# Patient Record
Sex: Male | Born: 1965
Health system: Southern US, Community
[De-identification: ages and names within clinical notes are randomized; demographics above are authoritative.]

## PROBLEM LIST (undated history)

## (undated) DIAGNOSIS — R0789 Other chest pain: Secondary | ICD-10-CM

## (undated) DIAGNOSIS — I10 Essential (primary) hypertension: Secondary | ICD-10-CM

## (undated) DIAGNOSIS — I209 Angina pectoris, unspecified: Secondary | ICD-10-CM

## (undated) DIAGNOSIS — E785 Hyperlipidemia, unspecified: Secondary | ICD-10-CM

## (undated) DIAGNOSIS — I2510 Atherosclerotic heart disease of native coronary artery without angina pectoris: Secondary | ICD-10-CM

## (undated) HISTORY — DX: Hyperlipidemia, unspecified: E78.5

## (undated) HISTORY — DX: Essential (primary) hypertension: I10

## (undated) HISTORY — PX: WRIST SURGERY: SHX841

## (undated) HISTORY — DX: Other chest pain: R07.89

---

## 2004-08-31 ENCOUNTER — Ambulatory Visit: Payer: Self-pay | Admitting: Family Medicine

## 2004-10-19 ENCOUNTER — Ambulatory Visit: Payer: Self-pay | Admitting: Family Medicine

## 2011-02-19 ENCOUNTER — Ambulatory Visit (INDEPENDENT_AMBULATORY_CARE_PROVIDER_SITE_OTHER): Payer: BC Managed Care – PPO | Admitting: Cardiology

## 2011-02-19 ENCOUNTER — Encounter: Payer: Self-pay | Admitting: Cardiology

## 2011-02-19 VITALS — BP 133/96 | HR 62 | Resp 18 | Ht 71.0 in | Wt 206.1 lb

## 2011-02-19 DIAGNOSIS — I10 Essential (primary) hypertension: Secondary | ICD-10-CM

## 2011-02-19 DIAGNOSIS — R079 Chest pain, unspecified: Secondary | ICD-10-CM

## 2011-02-19 DIAGNOSIS — R0789 Other chest pain: Secondary | ICD-10-CM

## 2011-02-19 DIAGNOSIS — E785 Hyperlipidemia, unspecified: Secondary | ICD-10-CM

## 2011-02-19 NOTE — Progress Notes (Signed)
HPI: [name] is seen in the office today at the kind request of Dr. Lysbeth Galas for evaluation of chest discomfort and dyspnea.  This very nice young gentleman has enjoyed generally excellent health. He has a history of hypertension, but no diabetes or significant hyperlipidemia. He does not use tobacco products and is unaware of any significant positive family history for coronary disease. In recent weeks, he has experienced episodes of chest tightness that are typically associated with physical or emotional stress and resolve over the course of seconds to minutes with rest and relaxation.  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 Discomfort is relatively mild and is unassociated with dyspnea, diaphoresis or nausea. [name] is no history of cardiac disease, has never been evaluated by a cardiologist and has not previously undergone any significant cardiac testing.  Current Outpatient Prescriptions  Medication Sig Dispense Refill  . ibuprofen (ADVIL) 200 MG tablet Take 400 mg by mouth daily.        Marland Kitchen lisinopril (PRINIVIL,ZESTRIL) 5 MG tablet Take 5 mg by mouth daily.           No Known Allergies    Past Medical History  Diagnosis Date  . Hypertension   . Chest discomfort      Past Surgical History  Procedure Date  . Wrist surgery     Left     History reviewed. No pertinent family history.   History   Social History  . Marital Status: Married    Spouse Name: N/A    Number of Children: 2  . Years of Education: N/A   Occupational History  . Teacher    Social History Main Topics  . Smoking status: Never Smoker   . Smokeless tobacco: Never Used  . Alcohol Use: 0.5 oz/week    1 drink(s) per week  . Drug Use: Not on file  . Sexually Active: Not on file   Other Topics Concern  . Not on file    Social History Narrative  . No narrative on file     ROS: Denies orthopnea, PND, pedal edema, lightheadedness or syncope. Weight and appetite have been stable. No cough nor sputum production.  All other systems reviewed and are negative.  PHYSICAL EXAM: BP 133/96  Pulse 62  Resp 18  Ht 5\' 11"  (1.803 m)  Wt 206 lb 1.9 oz (93.495 kg)  BMI 28.75 kg/m2  SpO2 97%  General-Well-developed; no acute distress Body Habitus- overweight HEENT-[place]/AT; PERRL; EOM intact; conjunctiva and lids nl Neck-No JVD; no carotid bruits Endocrine-No thyromegaly Lungs-Clear lung fields; resonant percussion; normal I-to-E ratio Cardiovascular- normal PMI; normal S1 and S2; + S4 Abdomen-BS normal; soft and non-tender without masses or organomegaly Musculoskeletal-No deformities, cyanosis or clubbing Neurologic-Nl cranial nerves; symmetric strength and tone Skin- Warm, no significant lesions Extremities-Nl distal pulses; no edema  EKG:   Sinus bradycardia at a rate of 55 bpm; otherwise normal; no previous tracing for comparison.  ASSESSMENT AND PLAN:

## 2011-02-19 NOTE — Patient Instructions (Addendum)
Your physician recommends that you schedule a follow-up appointment at time of stress echo  Your physician has requested that you have a stress echocardiogram. For further information please visit [email or web address] Please follow instruction sheet as given.

## 2011-02-21 ENCOUNTER — Encounter: Payer: Self-pay | Admitting: Cardiology

## 2011-02-21 DIAGNOSIS — I10 Essential (primary) hypertension: Secondary | ICD-10-CM | POA: Insufficient documentation

## 2011-02-21 DIAGNOSIS — R0789 Other chest pain: Secondary | ICD-10-CM | POA: Insufficient documentation

## 2011-02-21 DIAGNOSIS — E785 Hyperlipidemia, unspecified: Secondary | ICD-10-CM | POA: Insufficient documentation

## 2011-02-21 NOTE — Assessment & Plan Note (Signed)
Although Tag describes a fairly active lifestyle, I suspect his exercise intolerance and dyspnea are related to physical deconditioning. Chest discomfort is atypical and unlikely to represent myocardial ischemia. A stress echocardiogram will be performed to evaluate left ventricular function, exclude other structural heart disease, evaluate exercise tolerance and exclude evidence for myocardial ischemia. I will reassess his symptoms when he returns for stress testing and formulate additional plans based upon results of that study.

## 2011-02-22 ENCOUNTER — Ambulatory Visit (HOSPITAL_COMMUNITY)
Admission: RE | Admit: 2011-02-22 | Discharge: 2011-02-22 | Disposition: A | Discharge status: Home / Self Care | Payer: BC Managed Care – PPO | Source: Ambulatory Visit | Attending: Cardiology | Admitting: Cardiology

## 2011-02-22 ENCOUNTER — Encounter (HOSPITAL_COMMUNITY): Payer: Self-pay | Admitting: Cardiology

## 2011-02-22 DIAGNOSIS — R0789 Other chest pain: Secondary | ICD-10-CM

## 2011-02-22 DIAGNOSIS — E785 Hyperlipidemia, unspecified: Secondary | ICD-10-CM | POA: Insufficient documentation

## 2011-02-22 DIAGNOSIS — R072 Precordial pain: Secondary | ICD-10-CM

## 2011-02-22 DIAGNOSIS — I10 Essential (primary) hypertension: Secondary | ICD-10-CM | POA: Insufficient documentation

## 2011-02-22 DIAGNOSIS — R079 Chest pain, unspecified: Secondary | ICD-10-CM

## 2011-02-22 NOTE — Progress Notes (Signed)
Stress Lab Nurses Notes - Carl Kim 02/22/2011  Reason for doing test: Chest Pain  Type of test: Stress Echo  Nurse performing test: Parke Poisson, RN  Nuclear Medicine Tech: Not Applicable  Echo Tech: Karrie Doffing  MD performing test: Ival Bible & K. Lyman Bishop, NP  Family MD: Nyland  Test explained and consent signed: yes  IV started: No IV started  Symptoms: mild SOB & fatigue  Treatment/Intervention: None  Reason test stopped: fatigue  After recovery IV was: NA  Patient to return to Nuc. Med at :NA  Patient discharged: Home  Patient's Condition upon discharge was: stable  Comments: Peak BP 182/68 & Hr 174.  Recovery BP 130/68 & Hr 90.  Symptoms resolved in recovery.  [name]

## 2011-02-22 NOTE — Progress Notes (Signed)
*  PRELIMINARY RESULTS* Echocardiogram Echocardiogram Stress Test has been performed.  Carl Kim 02/22/2011, 10:53 AM

## 2011-06-17 ENCOUNTER — Encounter (HOSPITAL_COMMUNITY): Payer: Self-pay

## 2011-06-17 ENCOUNTER — Emergency Department (HOSPITAL_COMMUNITY): Payer: BC Managed Care – PPO

## 2011-06-17 ENCOUNTER — Emergency Department (HOSPITAL_COMMUNITY)
Admission: EM | Admit: 2011-06-17 | Discharge: 2011-06-17 | Disposition: A | Discharge status: Home / Self Care | Payer: BC Managed Care – PPO | Attending: Emergency Medicine | Admitting: Emergency Medicine

## 2011-06-17 DIAGNOSIS — R10819 Abdominal tenderness, unspecified site: Secondary | ICD-10-CM | POA: Insufficient documentation

## 2011-06-17 DIAGNOSIS — D72829 Elevated white blood cell count, unspecified: Secondary | ICD-10-CM | POA: Insufficient documentation

## 2011-06-17 DIAGNOSIS — I10 Essential (primary) hypertension: Secondary | ICD-10-CM | POA: Insufficient documentation

## 2011-06-17 DIAGNOSIS — R197 Diarrhea, unspecified: Secondary | ICD-10-CM | POA: Insufficient documentation

## 2011-06-17 DIAGNOSIS — R112 Nausea with vomiting, unspecified: Secondary | ICD-10-CM | POA: Insufficient documentation

## 2011-06-17 DIAGNOSIS — R109 Unspecified abdominal pain: Secondary | ICD-10-CM | POA: Insufficient documentation

## 2011-06-17 LAB — COMPREHENSIVE METABOLIC PANEL
ALT: 24 U/L (ref 0–53)
AST: 20 U/L (ref 0–37)
Albumin: 4.2 g/dL (ref 3.5–5.2)
Alkaline Phosphatase: 65 U/L (ref 39–117)
BUN: 23 mg/dL (ref 6–23)
CO2: 27 mEq/L (ref 19–32)
Calcium: 9.3 mg/dL (ref 8.4–10.5)
Chloride: 105 mEq/L (ref 96–112)
Creatinine, Ser: 1.15 mg/dL (ref 0.50–1.35)
GFR calc Af Amer: 89 mL/min — ABNORMAL LOW (ref >90)
GFR calc non Af Amer: 76 mL/min — ABNORMAL LOW (ref >90)
Glucose, Bld: 124 mg/dL — ABNORMAL HIGH (ref 70–99)
Potassium: 4.7 mEq/L (ref 3.5–5.1)
Sodium: 138 mEq/L (ref 135–145)
Total Bilirubin: 2 mg/dL — ABNORMAL HIGH (ref 0.3–1.2)
Total Protein: 6.8 g/dL (ref 6.0–8.3)

## 2011-06-17 LAB — CBC
HCT: 43.8 % (ref 39.0–52.0)
Hemoglobin: 15.2 g/dL (ref 13.0–17.0)
MCH: 29.9 pg (ref 26.0–34.0)
MCHC: 34.7 g/dL (ref 30.0–36.0)
MCV: 86.1 fL (ref 78.0–100.0)
Platelets: 182 10*3/uL (ref 150–400)
RBC: 5.09 MIL/uL (ref 4.22–5.81)
RDW: 12.2 % (ref 11.5–15.5)
WBC: 14.3 10*3/uL — ABNORMAL HIGH (ref 4.0–10.5)

## 2011-06-17 LAB — URINALYSIS, ROUTINE W REFLEX MICROSCOPIC
Bilirubin Urine: NEGATIVE
Glucose, UA: NEGATIVE mg/dL
Hgb urine dipstick: NEGATIVE
Ketones, ur: 15 mg/dL — AB
Leukocytes, UA: NEGATIVE
Nitrite: NEGATIVE
Protein, ur: NEGATIVE mg/dL
Specific Gravity, Urine: 1.01 (ref 1.005–1.030)
Urobilinogen, UA: 0.2 mg/dL (ref 0.0–1.0)
pH: 5.5 (ref 5.0–8.0)

## 2011-06-17 LAB — LIPASE, BLOOD: Lipase: 24 U/L (ref 11–59)

## 2011-06-17 MED ORDER — ONDANSETRON HCL 4 MG/2ML IJ SOLN
4.0000 mg | Freq: Once | INTRAMUSCULAR | Status: AC
  Administered 2011-06-17: 4 mg via INTRAVENOUS
  Filled 2011-06-17: qty 2

## 2011-06-17 MED ORDER — ONDANSETRON HCL 4 MG PO TABS: 4.0000 mg | ORAL_TABLET | Freq: Four times a day (QID) | ORAL | Status: AC

## 2011-06-17 MED ORDER — SODIUM CHLORIDE 0.9 % IV BOLUS (SEPSIS)
1000.0000 mL | Freq: Once | INTRAVENOUS | Status: AC
  Administered 2011-06-17: 1000 mL via INTRAVENOUS

## 2011-06-17 MED ORDER — IOHEXOL 300 MG/ML  SOLN
100.0000 mL | Freq: Once | INTRAMUSCULAR | Status: AC | PRN
  Administered 2011-06-17: 100 mL via INTRAVENOUS

## 2011-06-17 MED ORDER — PROMETHAZINE HCL 25 MG/ML IJ SOLN
12.5000 mg | Freq: Once | INTRAMUSCULAR | Status: AC
  Administered 2011-06-17: 12.5 mg via INTRAVENOUS
  Filled 2011-06-17: qty 1

## 2011-06-17 MED ORDER — HYDROMORPHONE HCL PF 1 MG/ML IJ SOLN
1.0000 mg | Freq: Once | INTRAMUSCULAR | Status: AC
  Administered 2011-06-17: 1 mg via INTRAVENOUS
  Filled 2011-06-17: qty 1

## 2011-06-17 NOTE — ED Notes (Signed)
Pt resting in bed, pt's wife stated "he is out after those meds but he said that he was feeling better".

## 2011-06-17 NOTE — ED Notes (Signed)
Pt stated emesis and diarrhea started at 4pm today.

## 2011-06-17 NOTE — ED Notes (Signed)
MD at bedside. 

## 2011-06-17 NOTE — ED Notes (Signed)
Pt medicated for pain and nausea 10/10. Will reassess.

## 2011-06-17 NOTE — ED Notes (Signed)
Per ems, pt has been vomiting since 1600 today.  Pt reports some mid abd pain with the vomiting.  Pt actively vomiting upon arrival.

## 2011-06-22 NOTE — ED Provider Notes (Signed)
History    45yM with n/v/d. Onset around 1600. Persistent since. Crampy abdominal pain. This began after vomiting. No fever or chills. nbnb emesis. No blood in stool. No sick contacts. Denies hx of abdominal surgery. No urinary complaints.  CSN: [id number]  Arrival date & time 06/17/11  2031   First MD Initiated Contact with Patient 06/17/11 2035      Chief Complaint  Patient presents with  . Emesis    (Consider location/radiation/quality/duration/timing/severity/associated sxs/prior treatment) HPI  Past Medical History  Diagnosis Date  . Hypertension   . Chest discomfort   . Hyperlipidemia 02/21/2011    Past Surgical History  Procedure Date  . Wrist surgery     Left    No family history on file.  History  Substance Use Topics  . Smoking status: Never Smoker   . Smokeless tobacco: Never Used  . Alcohol Use: 0.5 oz/week    1 drink(s) per week      Review of Systems   Review of symptoms negative unless otherwise noted in HPI.   Allergies  Review of patient's allergies indicates no known allergies.  Home Medications   Current Outpatient Rx  Name Route Sig Dispense Refill  . IBUPROFEN 200 MG PO TABS Oral Take 400 mg by mouth daily.      Marland Kitchen LISINOPRIL 5 MG PO TABS Oral Take 5 mg by mouth daily.      Marland Kitchen ONDANSETRON HCL 4 MG PO TABS Oral Take 1 tablet (4 mg total) by mouth every 6 (six) hours. 12 tablet 0    BP 132/74  Pulse 91  Temp(Src) 99.1 F (37.3 C) (Oral)  Resp 21  Ht 5\' 11"  (1.803 m)  Wt 195 lb (88.451 kg)  BMI 27.20 kg/m2  SpO2 98%  Physical Exam  Nursing note and vitals reviewed. Constitutional: He appears well-developed and well-nourished.       Sitting up in bed retching.  HENT:  Head: Normocephalic and atraumatic.  Eyes: Conjunctivae are normal. Right eye exhibits no discharge. Left eye exhibits no discharge.  Neck: Neck supple.  Cardiovascular: Normal rate, regular rhythm and normal heart sounds.  Exam reveals no gallop and no friction  rub.   No murmur heard. Pulmonary/Chest: Effort normal and breath sounds normal. No respiratory distress.  Abdominal: Soft. He exhibits no distension. There is tenderness.       Mild diffuse tenderness. No gaurding or rebound.  Genitourinary:       No cva tenderness  Musculoskeletal: He exhibits no edema and no tenderness.  Neurological: He is alert.  Skin: Skin is warm and dry. He is not diaphoretic.  Psychiatric: His behavior is normal. Thought content normal.    ED Course  Procedures (including critical care time)  Labs Reviewed  COMPREHENSIVE METABOLIC PANEL - Abnormal; Notable for the following:    Glucose, Bld 124 (*)    Total Bilirubin 2.0 (*)    GFR calc non Af Amer 76 (*)    GFR calc Af Amer 89 (*)    All other components within normal limits  URINALYSIS, ROUTINE W REFLEX MICROSCOPIC - Abnormal; Notable for the following:    Ketones, ur 15 (*)    All other components within normal limits  CBC - Abnormal; Notable for the following:    WBC 14.3 (*)    All other components within normal limits  LIPASE, BLOOD  LAB REPORT - SCANNED   No results found.   1. Abdominal pain   2. Nausea and vomiting  3. Diarrhea       MDM  45yM with abdominal pain and n/v/d. Consider viral, obstruction, pancreatitis, etc. Suspect viral. Only hours of symptoms and improved with IVF and meds. CT with no acute findings. Mild leukocytosis, suspect stress demargination. Plan continued symptomatic care and fu as needed. Return precautions discussed.        Raeford Razor, MD 06/22/11 1052

## 2011-11-16 ENCOUNTER — Emergency Department (HOSPITAL_COMMUNITY)
Admission: EM | Admit: 2011-11-16 | Discharge: 2011-11-16 | Disposition: A | Discharge status: Home / Self Care | Payer: BC Managed Care – PPO | Source: Home / Self Care | Attending: Family Medicine | Admitting: Family Medicine

## 2011-11-16 ENCOUNTER — Encounter (HOSPITAL_COMMUNITY): Payer: Self-pay

## 2011-11-16 DIAGNOSIS — G510 Bell's palsy: Secondary | ICD-10-CM

## 2011-11-16 NOTE — ED Provider Notes (Signed)
History     CSN: [id number]  Arrival date & time 11/16/11  1102   First MD Initiated Contact with Patient 11/16/11 1154      Chief Complaint  Patient presents with  . Facial Droop    (Consider location/radiation/quality/duration/timing/severity/associated sxs/prior treatment) HPI Comments: Pt with L face/head numbness and pain behind L ear for 2 weeks. Saw pcp, told probably with sinus infection.  Saw pcp again 3 days ago for worsening sx, told maybe had lyme disease, rx an antibiotic.  Last night began having facial droop, can't close eye.  Saw pcp this morning again, told had Bell palsy. Here for 2nd opinion.    Patient is a 46 y.o. male presenting with neurologic complaint. The history is provided by the patient.  Neurologic Problem The primary symptoms include focal weakness. Primary symptoms do not include dizziness or fever. The symptoms began yesterday. The symptoms are unchanged. The neurological symptoms are focal.  Weakness began 12 - 24 hours ago. The weakness is unchanged. There is weakness in these regions/motions: facial muscles. There is impairment of the following actions: closing eyes.  Additional symptoms do not include photophobia. Medical issues also include hypertension.    Past Medical History  Diagnosis Date  . Hypertension   . Chest discomfort   . Hyperlipidemia 02/21/2011    Past Surgical History  Procedure Date  . Wrist surgery     Left    History reviewed. No pertinent family history.  History  Substance Use Topics  . Smoking status: Never Smoker   . Smokeless tobacco: Never Used  . Alcohol Use: 0.5 oz/week    1 drink(s) per week      Review of Systems  Constitutional: Negative for fever and chills.  HENT: Positive for ear pain and rhinorrhea. Negative for facial swelling, neck pain and ear discharge.        Pain behind L ear  Eyes: Positive for discharge. Negative for photophobia.       Eye watering, not eye discharge; can't close eye    Skin: Negative for color change and wound.  Neurological: Positive for focal weakness, facial asymmetry and numbness. Negative for dizziness.    Allergies  Review of patient's allergies indicates no known allergies.  Home Medications   Current Outpatient Rx  Name Route Sig Dispense Refill  . ACYCLOVIR 800 MG PO TABS Oral Take 800 mg by mouth 2 (two) times daily.    Marland Kitchen PREDNISONE (PAK) 10 MG PO TABS Oral Take 10 mg by mouth daily.    . IBUPROFEN 200 MG PO TABS Oral Take 400 mg by mouth daily.      Marland Kitchen LISINOPRIL 5 MG PO TABS Oral Take 5 mg by mouth daily.        BP 156/100  Pulse 70  Temp 97.3 F (36.3 C) (Oral)  Resp 18  SpO2 100%  Physical Exam  Constitutional: He is oriented to person, place, and time. He appears well-developed and well-nourished. No distress.  HENT:  Head: Normocephalic and atraumatic.  Right Ear: Tympanic membrane, external ear and ear canal normal. No mastoid tenderness.  Left Ear: Tympanic membrane, external ear and ear canal normal. No mastoid tenderness.       L facial droop. Postauricular tenderness.   Eyes: Right eye exhibits no discharge and no exudate. Left eye exhibits no discharge and no exudate.       L eyelid does not completely close.   Pulmonary/Chest: Effort normal.  Neurological: He is alert and oriented  to person, place, and time. A sensory deficit is present. Coordination and gait normal.       L facial droop; B grips strength equal and strong    ED Course  Procedures (including critical care time)  Labs Reviewed - No data to display No results found.   1. Bell palsy       MDM  Sx c/w bell palsy.  Reassured pt about diagnosis.  Discussed potential for eye injury.         Cathlyn Parsons, NP 11/16/11 1202

## 2011-11-16 NOTE — Discharge Instructions (Signed)
Purchase artificial tears and use several times a day to help keep your moist.  At night try using an eye lubricant (such as Lacrilube).  You might also try taping your eye shut at night.  Be sure to monitor the taped eye through the night- if the tape comes off, don't try taping it again.  It's important to make sure the tape doesn't get into/injure the eye.  Please see an opthamologist if you have any concerns about your eye.    Pain behind the ear is VERY common in Bell's Palsy.   Bell's Palsy Bell's palsy is a condition in which the muscles on one side of the face cannot move (paralysis). This is because the nerves in the face are paralyzed. It is most often thought to be caused by a virus. The virus causes swelling of the nerve that controls movement on one side of the face. The nerve travels through a tight space surrounded by bone. When the nerve swells, it can be compressed by the bone. This results in damage to the protective covering around the nerve. This damage interferes with how the nerve communicates with the muscles of the face. As a result, it can cause weakness or paralysis of the facial muscles.  Injury (trauma), tumor, and surgery may cause Bell's palsy, but most of the time the cause is unknown. It is a relatively common condition. It starts suddenly (abrupt onset) with the paralysis usually ending within 2 days. Bell's palsy is not dangerous. But because the eye does not close properly, you may need care to keep the eye from getting dry. This can include splinting (to keep the eye shut) or moistening with artificial tears. Bell's palsy very seldom occurs on both sides of the face at the same time. SYMPTOMS   Eyebrow sagging.   Drooping of the eyelid and corner of the mouth.   Inability to close one eye.   Loss of taste on the front of the tongue.   Sensitivity to loud noises.  TREATMENT  The treatment is usually non-surgical. If the patient is seen within the first 24 to 48  hours, a short course of steroids may be prescribed, in an attempt to shorten the length of the condition. Antiviral medicines may also be used with the steroids, but it is unclear if they are helpful.  You will need to protect your eye, if you cannot close it. The cornea (clear covering over your eye) will become dry and can be damaged. Artificial tears can be used to keep your eye moist. Glasses or an eye patch should be worn to protect your eye. PROGNOSIS  Recovery is variable, ranging from days to months. Although the problem usually goes away completely (about 80% of cases resolve), predicting the outcome is impossible. Most people improve within 3 weeks of when the symptoms began. Improvement may continue for 3 to 6 months. A small number of people have moderate to severe weakness that is permanent.  HOME CARE INSTRUCTIONS   If your caregiver prescribed medication to reduce swelling in the nerve, use as directed. Do not stop taking the medication unless directed by your caregiver.   Use moisturizing eye drops as needed to prevent drying of your eye, as directed by your caregiver.   Protect your eye, as directed by your caregiver.   Use facial massage and exercises, as directed by your caregiver.   Perform your normal activities, and get your normal rest.  SEEK IMMEDIATE MEDICAL CARE IF:  There is pain, redness or irritation in the eye.   You or your child has an oral temperature above 102 F (38.9 C), not controlled by medicine.  MAKE SURE YOU:   Understand these instructions.   Will watch your condition.   Will get help right away if you are not doing well or get worse.  Document Released: 05/21/2005 Document Revised: 05/10/2011 Document Reviewed: 05/30/2009 [facility] Patient Information 2012 [place], [place].

## 2011-11-16 NOTE — ED Notes (Signed)
States he was in his MD office this AM for 2 week duration of facial weakness/numbness and concern for poss Lymes disease(no known hist of tick bite) Has left sided facial sagging and reported numbness . Has already started acyclovir and prednisone , doxycycline; "I want a second opinion"

## 2011-11-17 NOTE — ED Provider Notes (Signed)
Medical screening examination/treatment/procedure(s) were performed by resident physician or non-physician practitioner and as supervising physician I was immediately available for consultation/collaboration.   Barkley Bruns MD.    Linna Hoff, MD 11/17/11 [phone]

## 2012-06-04 DIAGNOSIS — R0789 Other chest pain: Secondary | ICD-10-CM

## 2012-06-04 HISTORY — DX: Other chest pain: R07.89

## 2013-02-17 ENCOUNTER — Ambulatory Visit: Payer: BC Managed Care – PPO | Attending: Neurosurgery | Admitting: Physical Therapy

## 2013-02-17 DIAGNOSIS — IMO0001 Reserved for inherently not codable concepts without codable children: Secondary | ICD-10-CM | POA: Insufficient documentation

## 2013-02-17 DIAGNOSIS — R5381 Other malaise: Secondary | ICD-10-CM | POA: Insufficient documentation

## 2013-02-17 DIAGNOSIS — M6281 Muscle weakness (generalized): Secondary | ICD-10-CM | POA: Insufficient documentation

## 2013-02-17 DIAGNOSIS — M542 Cervicalgia: Secondary | ICD-10-CM | POA: Insufficient documentation

## 2013-02-19 ENCOUNTER — Ambulatory Visit: Payer: BC Managed Care – PPO | Admitting: *Deleted

## 2013-02-23 ENCOUNTER — Ambulatory Visit: Payer: BC Managed Care – PPO | Admitting: Physical Therapy

## 2013-02-25 ENCOUNTER — Ambulatory Visit: Payer: BC Managed Care – PPO | Admitting: Physical Therapy

## 2013-03-02 ENCOUNTER — Ambulatory Visit: Payer: BC Managed Care – PPO | Admitting: Physical Therapy

## 2013-03-04 ENCOUNTER — Ambulatory Visit: Payer: BC Managed Care – PPO | Attending: Neurosurgery | Admitting: Physical Therapy

## 2013-03-04 DIAGNOSIS — M542 Cervicalgia: Secondary | ICD-10-CM | POA: Insufficient documentation

## 2013-03-04 DIAGNOSIS — M6281 Muscle weakness (generalized): Secondary | ICD-10-CM | POA: Insufficient documentation

## 2013-03-04 DIAGNOSIS — IMO0001 Reserved for inherently not codable concepts without codable children: Secondary | ICD-10-CM | POA: Insufficient documentation

## 2013-03-04 DIAGNOSIS — R5381 Other malaise: Secondary | ICD-10-CM | POA: Insufficient documentation

## 2013-03-09 ENCOUNTER — Ambulatory Visit: Payer: BC Managed Care – PPO | Admitting: Physical Therapy

## 2013-03-11 ENCOUNTER — Ambulatory Visit: Payer: BC Managed Care – PPO | Admitting: Physical Therapy

## 2014-06-04 HISTORY — PX: CERVICAL DISCECTOMY: SHX98

## 2017-10-02 ENCOUNTER — Encounter: Payer: Self-pay | Admitting: Internal Medicine

## 2017-10-17 DIAGNOSIS — M9902 Segmental and somatic dysfunction of thoracic region: Secondary | ICD-10-CM | POA: Diagnosis not present

## 2017-10-17 DIAGNOSIS — M9903 Segmental and somatic dysfunction of lumbar region: Secondary | ICD-10-CM | POA: Diagnosis not present

## 2017-10-17 DIAGNOSIS — M62830 Muscle spasm of back: Secondary | ICD-10-CM | POA: Diagnosis not present

## 2017-10-17 DIAGNOSIS — M9901 Segmental and somatic dysfunction of cervical region: Secondary | ICD-10-CM | POA: Diagnosis not present

## 2017-10-21 DIAGNOSIS — M9902 Segmental and somatic dysfunction of thoracic region: Secondary | ICD-10-CM | POA: Diagnosis not present

## 2017-10-21 DIAGNOSIS — M9901 Segmental and somatic dysfunction of cervical region: Secondary | ICD-10-CM | POA: Diagnosis not present

## 2017-10-21 DIAGNOSIS — M9903 Segmental and somatic dysfunction of lumbar region: Secondary | ICD-10-CM | POA: Diagnosis not present

## 2017-10-21 DIAGNOSIS — M62830 Muscle spasm of back: Secondary | ICD-10-CM | POA: Diagnosis not present

## 2017-10-23 DIAGNOSIS — M9903 Segmental and somatic dysfunction of lumbar region: Secondary | ICD-10-CM | POA: Diagnosis not present

## 2017-10-23 DIAGNOSIS — M62830 Muscle spasm of back: Secondary | ICD-10-CM | POA: Diagnosis not present

## 2017-10-23 DIAGNOSIS — M9901 Segmental and somatic dysfunction of cervical region: Secondary | ICD-10-CM | POA: Diagnosis not present

## 2017-10-23 DIAGNOSIS — M9902 Segmental and somatic dysfunction of thoracic region: Secondary | ICD-10-CM | POA: Diagnosis not present

## 2017-10-30 DIAGNOSIS — M62830 Muscle spasm of back: Secondary | ICD-10-CM | POA: Diagnosis not present

## 2017-10-30 DIAGNOSIS — M9903 Segmental and somatic dysfunction of lumbar region: Secondary | ICD-10-CM | POA: Diagnosis not present

## 2017-10-30 DIAGNOSIS — M9902 Segmental and somatic dysfunction of thoracic region: Secondary | ICD-10-CM | POA: Diagnosis not present

## 2017-10-30 DIAGNOSIS — M9901 Segmental and somatic dysfunction of cervical region: Secondary | ICD-10-CM | POA: Diagnosis not present

## 2017-11-13 ENCOUNTER — Other Ambulatory Visit: Payer: Self-pay

## 2017-11-27 DIAGNOSIS — M62830 Muscle spasm of back: Secondary | ICD-10-CM | POA: Diagnosis not present

## 2017-11-27 DIAGNOSIS — M9903 Segmental and somatic dysfunction of lumbar region: Secondary | ICD-10-CM | POA: Diagnosis not present

## 2017-11-27 DIAGNOSIS — M9902 Segmental and somatic dysfunction of thoracic region: Secondary | ICD-10-CM | POA: Diagnosis not present

## 2017-11-27 DIAGNOSIS — M9901 Segmental and somatic dysfunction of cervical region: Secondary | ICD-10-CM | POA: Diagnosis not present

## 2017-11-28 ENCOUNTER — Telehealth: Payer: Self-pay

## 2017-11-28 NOTE — Telephone Encounter (Signed)
Patient No Showed for Pre-Visit today. A message was left on voice mail to call and reschedule his PV before 5 Pm today. If patient does not call a no show letter will be mailed and his colonoscopy that is scheduled for 12/18/17 will be cancelled per [facility] guidelines.   Janalee DaneNancy Shalina Norfolk, LPN  ( PV )

## 2017-12-03 ENCOUNTER — Ambulatory Visit (AMBULATORY_SURGERY_CENTER): Payer: Self-pay

## 2017-12-03 VITALS — Ht 70.0 in | Wt 210.6 lb

## 2017-12-03 DIAGNOSIS — Z1211 Encounter for screening for malignant neoplasm of colon: Secondary | ICD-10-CM

## 2017-12-03 NOTE — Progress Notes (Signed)
Per pt, no allergies to soy or egg products.Pt not taking any weight loss meds or using  O2 at home.  Pt refused emmi video. 

## 2017-12-04 ENCOUNTER — Encounter: Payer: Self-pay | Admitting: Internal Medicine

## 2017-12-12 ENCOUNTER — Encounter: Payer: Self-pay | Admitting: Internal Medicine

## 2017-12-18 ENCOUNTER — Encounter: Payer: Self-pay | Admitting: Internal Medicine

## 2017-12-18 ENCOUNTER — Ambulatory Visit (AMBULATORY_SURGERY_CENTER): Payer: BLUE CROSS/BLUE SHIELD | Admitting: Internal Medicine

## 2017-12-18 VITALS — BP 106/74 | HR 63 | Temp 97.8°F | Resp 15 | Ht 70.0 in | Wt 210.0 lb

## 2017-12-18 DIAGNOSIS — Z1211 Encounter for screening for malignant neoplasm of colon: Secondary | ICD-10-CM | POA: Diagnosis present

## 2017-12-18 MED ORDER — SODIUM CHLORIDE 0.9 % IV SOLN: 500.0000 mL | Freq: Once | INTRAVENOUS | Status: DC

## 2017-12-18 NOTE — Progress Notes (Signed)
Report given to PACU, vss 

## 2017-12-18 NOTE — Op Note (Signed)
[facility] Patient Name: Carl Kim Procedure Date: 12/18/2017 8:56 AM MRN: [id number] Endoscopist: Iva Boop , MD Age: 52 Referring MD:  Date of Birth: 1965/10/09 Gender: Male Account #: [id number] Procedure:                Colonoscopy Indications:              Screening for colorectal malignant neoplasm, This                            is the patient's first colonoscopy Medicines:                Propofol per Anesthesia, Monitored Anesthesia Care Procedure:                Pre-Anesthesia Assessment:                           - Prior to the procedure, a History and Physical                            was performed, and patient medications and                            allergies were reviewed. The patient's tolerance of                            previous anesthesia was also reviewed. The risks                            and benefits of the procedure and the sedation                            options and risks were discussed with the patient.                            All questions were answered, and informed consent                            was obtained. Prior Anticoagulants: The patient has                            taken no previous anticoagulant or antiplatelet                            agents. ASA Grade Assessment: II - A patient with                            mild systemic disease. After reviewing the risks                            and benefits, the patient was deemed in                            satisfactory condition to undergo the procedure.  After obtaining informed consent, the colonoscope                            was passed under direct vision. Throughout the                            procedure, the patient's blood pressure, pulse, and                            oxygen saturations were monitored continuously. The                            Colonoscope was introduced through the anus and   advanced to the the cecum, identified by                            appendiceal orifice and ileocecal valve. The                            quality of the bowel preparation was excellent. The                            colonoscopy was performed without difficulty. The                            patient tolerated the procedure well. The bowel                            preparation used was Miralax. Scope In: 9:11:09 AM Scope Out: 9:24:29 AM Scope Withdrawal Time: 0 hours 10 minutes 34 seconds  Total Procedure Duration: 0 hours 13 minutes 20 seconds  Findings:                 The perianal and digital rectal examinations were                            normal. Pertinent negatives include normal prostate                            (size, shape, and consistency).                           The colon (entire examined portion) appeared normal.                           No additional abnormalities were found on                            retroflexion. Complications:            No immediate complications. Estimated blood loss:                            None. Estimated Blood Loss:     Estimated blood loss: none. Recommendation:           - Repeat colonoscopy or other appropriate test  in                            10 years for screening purposes.                           - Patient has a contact number available for                            emergencies. The signs and symptoms of potential                            delayed complications were discussed with the                            patient. Return to normal activities tomorrow.                            Written discharge instructions were provided to the                            patient.                           - Resume previous diet.                           - Continue present medications. Iva Boop, MD 12/18/2017 9:28:41 AM This report has been signed electronically.

## 2017-12-18 NOTE — Progress Notes (Signed)
Pt's states no medical or surgical changes since previsit or office visit. 

## 2017-12-18 NOTE — Patient Instructions (Addendum)
   Your colonoscopy is normal!  Next routine colonoscopy or other screening test in 10 years - 2029  I appreciate the opportunity to care for you. Iva Booparl E. Athens Lebeau, MD, FACG YOU HAD AN ENDOSCOPIC PROCEDURE TODAY AT THE [facility]:   Refer to the procedure report that was given to you for any specific questions about what was found during the examination.  If the procedure report does not answer your questions, please call your gastroenterologist to clarify.  If you requested that your care partner not be given the details of your procedure findings, then the procedure report has been included in a sealed envelope for you to review at your convenience later.  YOU SHOULD EXPECT: Some feelings of bloating in the abdomen. Passage of more gas than usual.  Walking can help get rid of the air that was put into your GI tract during the procedure and reduce the bloating. If you had a lower endoscopy (such as a colonoscopy or flexible sigmoidoscopy) you may notice spotting of blood in your stool or on the toilet paper. If you underwent a bowel prep for your procedure, you may not have a normal bowel movement for a few days.  Please Note:  You might notice some irritation and congestion in your nose or some drainage.  This is from the oxygen used during your procedure.  There is no need for concern and it should clear up in a day or so.  SYMPTOMS TO REPORT IMMEDIATELY:   Following lower endoscopy (colonoscopy or flexible sigmoidoscopy):  Excessive amounts of blood in the stool  Significant tenderness or worsening of abdominal pains  Swelling of the abdomen that is new, acute  Fever of 100F or higher  For urgent or emergent issues, a gastroenterologist can be reached at any hour by calling [phone].   DIET:  We do recommend a small meal at first, but then you may proceed to your regular diet.  Drink plenty of fluids but you should avoid alcoholic beverages for 24  hours.  MEDICATIONS: Continue present medications.   ACTIVITY:  You should plan to take it easy for the rest of today and you should NOT DRIVE or use heavy machinery until tomorrow (because of the sedation medicines used during the test).    FOLLOW UP: Our staff will call the number listed on your records the next business day following your procedure to check on you and address any questions or concerns that you may have regarding the information given to you following your procedure. If we do not reach you, we will leave a message.  However, if you are feeling well and you are not experiencing any problems, there is no need to return our call.  We will assume that you have returned to your regular daily activities without incident.  If any biopsies were taken you will be contacted by phone or by letter within the next 1-3 weeks.  Please call us at [phone] if you have not heard about the biopsies in 3 weeks.   Thank you for allowing us to provide for your healthcare needs today.   SIGNATURES/CONFIDENTIALITY: You and/or your care partner have signed paperwork which will be entered into your electronic medical record.  These signatures attest to the fact that that the information above on your After Visit Summary has been reviewed and is understood.  Full responsibility of the confidentiality of this discharge information lies with you and/or your care-partner.

## 2017-12-19 ENCOUNTER — Telehealth: Payer: Self-pay

## 2017-12-19 NOTE — Telephone Encounter (Signed)
  Follow up Call-  Call back number 12/18/2017  Post procedure Call Back phone  # [phone]  Permission to leave phone message Yes  Some recent data might be hidden     Patient questions:  Do you have a fever, pain , or abdominal swelling? No. Pain Score  0 *  Have you tolerated food without any problems? Yes.    Have you been able to return to your normal activities? Yes.    Do you have any questions about your discharge instructions: Diet   No. Medications  No. Follow up visit  No.  Do you have questions or concerns about your Care? No.  Actions: * If pain score is 4 or above: No action needed, pain <4.

## 2017-12-25 DIAGNOSIS — M9903 Segmental and somatic dysfunction of lumbar region: Secondary | ICD-10-CM | POA: Diagnosis not present

## 2017-12-25 DIAGNOSIS — M62830 Muscle spasm of back: Secondary | ICD-10-CM | POA: Diagnosis not present

## 2017-12-25 DIAGNOSIS — M9902 Segmental and somatic dysfunction of thoracic region: Secondary | ICD-10-CM | POA: Diagnosis not present

## 2017-12-25 DIAGNOSIS — M9901 Segmental and somatic dysfunction of cervical region: Secondary | ICD-10-CM | POA: Diagnosis not present

## 2018-01-22 DIAGNOSIS — M62830 Muscle spasm of back: Secondary | ICD-10-CM | POA: Diagnosis not present

## 2018-01-22 DIAGNOSIS — M9901 Segmental and somatic dysfunction of cervical region: Secondary | ICD-10-CM | POA: Diagnosis not present

## 2018-01-22 DIAGNOSIS — M9902 Segmental and somatic dysfunction of thoracic region: Secondary | ICD-10-CM | POA: Diagnosis not present

## 2018-01-22 DIAGNOSIS — M9903 Segmental and somatic dysfunction of lumbar region: Secondary | ICD-10-CM | POA: Diagnosis not present

## 2018-02-20 DIAGNOSIS — M62830 Muscle spasm of back: Secondary | ICD-10-CM | POA: Diagnosis not present

## 2018-02-20 DIAGNOSIS — M9903 Segmental and somatic dysfunction of lumbar region: Secondary | ICD-10-CM | POA: Diagnosis not present

## 2018-02-20 DIAGNOSIS — M9902 Segmental and somatic dysfunction of thoracic region: Secondary | ICD-10-CM | POA: Diagnosis not present

## 2018-02-20 DIAGNOSIS — M9901 Segmental and somatic dysfunction of cervical region: Secondary | ICD-10-CM | POA: Diagnosis not present

## 2018-02-24 DIAGNOSIS — M779 Enthesopathy, unspecified: Secondary | ICD-10-CM | POA: Diagnosis not present

## 2018-02-24 DIAGNOSIS — M79641 Pain in right hand: Secondary | ICD-10-CM | POA: Diagnosis not present

## 2018-04-17 DIAGNOSIS — M9901 Segmental and somatic dysfunction of cervical region: Secondary | ICD-10-CM | POA: Diagnosis not present

## 2018-04-17 DIAGNOSIS — M9902 Segmental and somatic dysfunction of thoracic region: Secondary | ICD-10-CM | POA: Diagnosis not present

## 2018-04-17 DIAGNOSIS — M9903 Segmental and somatic dysfunction of lumbar region: Secondary | ICD-10-CM | POA: Diagnosis not present

## 2018-04-17 DIAGNOSIS — M62830 Muscle spasm of back: Secondary | ICD-10-CM | POA: Diagnosis not present

## 2018-09-03 DIAGNOSIS — H10013 Acute follicular conjunctivitis, bilateral: Secondary | ICD-10-CM | POA: Diagnosis not present

## 2018-09-22 DIAGNOSIS — I10 Essential (primary) hypertension: Secondary | ICD-10-CM | POA: Diagnosis not present

## 2019-02-04 ENCOUNTER — Other Ambulatory Visit: Payer: Self-pay | Admitting: Emergency Medicine

## 2019-02-04 DIAGNOSIS — Z20822 Contact with and (suspected) exposure to covid-19: Secondary | ICD-10-CM

## 2019-02-06 LAB — NOVEL CORONAVIRUS, NAA: SARS-CoV-2, NAA: NOT DETECTED

## 2019-03-16 ENCOUNTER — Other Ambulatory Visit: Payer: Self-pay

## 2019-03-16 DIAGNOSIS — Z20828 Contact with and (suspected) exposure to other viral communicable diseases: Secondary | ICD-10-CM | POA: Diagnosis not present

## 2019-03-16 DIAGNOSIS — Z20822 Contact with and (suspected) exposure to covid-19: Secondary | ICD-10-CM

## 2019-03-17 LAB — NOVEL CORONAVIRUS, NAA: SARS-CoV-2, NAA: DETECTED — AB

## 2019-03-23 ENCOUNTER — Other Ambulatory Visit: Payer: Self-pay

## 2019-03-23 DIAGNOSIS — Z20822 Contact with and (suspected) exposure to covid-19: Secondary | ICD-10-CM

## 2019-03-25 LAB — NOVEL CORONAVIRUS, NAA: SARS-CoV-2, NAA: DETECTED — AB

## 2019-04-08 DIAGNOSIS — L570 Actinic keratosis: Secondary | ICD-10-CM | POA: Diagnosis not present

## 2019-04-08 DIAGNOSIS — D229 Melanocytic nevi, unspecified: Secondary | ICD-10-CM | POA: Diagnosis not present

## 2019-04-08 DIAGNOSIS — B360 Pityriasis versicolor: Secondary | ICD-10-CM | POA: Diagnosis not present

## 2019-04-10 DIAGNOSIS — Z23 Encounter for immunization: Secondary | ICD-10-CM | POA: Diagnosis not present

## 2019-04-10 DIAGNOSIS — M25552 Pain in left hip: Secondary | ICD-10-CM | POA: Diagnosis not present

## 2019-04-10 DIAGNOSIS — G8929 Other chronic pain: Secondary | ICD-10-CM | POA: Diagnosis not present

## 2019-04-10 DIAGNOSIS — I10 Essential (primary) hypertension: Secondary | ICD-10-CM | POA: Diagnosis not present

## 2019-04-10 DIAGNOSIS — R079 Chest pain, unspecified: Secondary | ICD-10-CM | POA: Diagnosis not present

## 2019-04-13 DIAGNOSIS — R079 Chest pain, unspecified: Secondary | ICD-10-CM | POA: Diagnosis not present

## 2019-04-22 DIAGNOSIS — M25552 Pain in left hip: Secondary | ICD-10-CM | POA: Diagnosis not present

## 2019-04-22 DIAGNOSIS — M7062 Trochanteric bursitis, left hip: Secondary | ICD-10-CM | POA: Diagnosis not present

## 2019-05-06 DIAGNOSIS — Z7189 Other specified counseling: Secondary | ICD-10-CM | POA: Insufficient documentation

## 2019-05-06 DIAGNOSIS — R079 Chest pain, unspecified: Secondary | ICD-10-CM | POA: Insufficient documentation

## 2019-05-06 NOTE — Progress Notes (Signed)
Cardiology Office Note   Date:  05/07/2019   ID:  Carl Kim, DOB 01-03-1966, MRN [id number]  PCP:  Roderick Pee, PA  Cardiologist:   No primary care provider on file. Referring:  Roderick Pee, PA  No chief complaint on file.  History of Present Illness: Carl Kim is a 53 y.o. male who is referred by Roderick Pee, PA for evaluation of chest pain.     He was seen by Dr. Dietrich Pates in 2012 for chest pain.  He had a negative stress echo.  He comes back with chest discomfort.  This has been happening for quite a while.  It is a 4 out of 10 discomfort and happens sporadically.  It is under his left breast.  It is an aching discomfort that seems to be sporadic.  There is no associated nausea vomiting diaphoresis.  It kind of comes and goes.  He cannot bring it on with activity.  He does some work outside and walks for playing golf.  This does not bring it on.  He does get more short of breath climbing stairs than he used to.  He describes the discomfort as somewhat of a heaviness.  There is no radiation to his arm or to his jaw.  He does not have any resting shortness of breath, PND or orthopnea.  He has no weight gain or edema.  He tires more easily.  He does have stress at a new job.  Past Medical History:  Diagnosis Date  . Chest discomfort 2014   due to food poisoning/went to hospital per EMS  . Hypertension     Past Surgical History:  Procedure Laterality Date  . CERVICAL DISCECTOMY  2016   C4-5  . WRIST SURGERY     Left     Current Outpatient Medications  Medication Sig Dispense Refill  . aspirin EC 81 MG tablet Take 81 mg by mouth daily.    Marland Kitchen ibuprofen (ADVIL) 200 MG tablet Take 400 mg by mouth daily.      Marland Kitchen lisinopril-hydrochlorothiazide (PRINZIDE,ZESTORETIC) 10-12.5 MG tablet Take 1 tablet by mouth daily.     Current Facility-Administered Medications  Medication Dose Route Frequency Provider Last Rate Last Dose  . 0.9 %  sodium chloride infusion  500 mL  Intravenous Once Iva Boop, MD        Allergies:   Patient has no known allergies.    Social History:  The patient  reports that he has never smoked. He has never used smokeless tobacco. He reports current alcohol use of about 10.0 - 12.0 standard drinks of alcohol per week.   Family History:  The patient's family history includes Diabetes in his brother and father; Leukemia in his father.    ROS:  Please see the history of present illness.   Otherwise, review of systems are positive for none.   All other systems are reviewed and negative.    PHYSICAL EXAM: VS:  BP 118/76   Pulse 66   Temp 98.2 F (36.8 C)   Ht 5\' 10"  (1.778 m)   Wt 214 lb 9.6 oz (97.3 kg)   SpO2 97%   BMI 30.79 kg/m  , BMI Body mass index is 30.79 kg/m. GENERAL:  Well appearing HEENT:  Pupils equal round and reactive, fundi not visualized, oral mucosa unremarkable NECK:  No jugular venous distention, waveform within normal limits, carotid upstroke brisk and symmetric, no bruits, no thyromegaly LYMPHATICS:  No cervical, inguinal  adenopathy LUNGS:  Clear to auscultation bilaterally BACK:  No CVA tenderness CHEST:  Unremarkable HEART:  PMI not displaced or sustained,S1 and S2 within normal limits, no S3, no S4, no clicks, no rubs, no murmurs ABD:  Flat, positive bowel sounds normal in frequency in pitch, no bruits, no rebound, no guarding, no midline pulsatile mass, no hepatomegaly, no splenomegaly EXT:  2 plus pulses throughout, no edema, no cyanosis no clubbing SKIN:  No rashes no nodules NEURO:  Cranial nerves II through XII grossly intact, motor grossly intact throughout PSYCH:  Cognitively intact, oriented to person place and time    EKG:  EKG is ordered today. The ekg ordered today demonstrates sinus rhythm, rate 66, axis within normal limits, intervals within normal limits, no acute ST-T wave changes.   Recent Labs: No results found for requested labs within last 8760 hours.    Lipid Panel  No results found for: CHOL, TRIG, HDL, CHOLHDL, VLDL, LDLCALC, LDLDIRECT    Wt Readings from Last 3 Encounters:  05/07/19 214 lb 9.6 oz (97.3 kg)  12/18/17 210 lb (95.3 kg)  12/03/17 210 lb 9.6 oz (95.5 kg)      Other studies Reviewed: Additional studies/ records that were reviewed today include: None. Review of the above records demonstrates:  Please see elsewhere in the note.     ASSESSMENT AND PLAN:  CHEST PAIN:   Chest pain somewhat atypical but he does have cardiovascular risk factors. I will bring the patient back for a POET (Plain Old Exercise Test). This will allow me to screen for obstructive coronary disease, risk stratify and very importantly provide a prescription for exercise.  I will also get a coronary calcium score.  RISK REDUCTION: We will have him get a lipid profile and other routine blood work when he comes back.  HTN:  The blood pressure is at target. No change in medications is indicated. We will continue with therapeutic lifestyle changes (TLC).  Current medicines are reviewed at length with the patient today.  The patient does not have concerns regarding medicines.  The following changes have been made:  no change  Labs/ tests ordered today include:   Orders Placed This Encounter  Procedures  . CT CARDIAC SCORING  . Lipid Profile  . CBC  . TSH  . EXERCISE TOLERANCE TEST (ETT)  . EKG 12-Lead     Disposition:   FU with me as needed.      Signed, Minus Breeding, MD  05/07/2019 2:13 PM    [facility]

## 2019-05-07 ENCOUNTER — Ambulatory Visit: Payer: BC Managed Care – PPO | Admitting: Cardiology

## 2019-05-07 ENCOUNTER — Encounter: Payer: Self-pay | Admitting: Cardiology

## 2019-05-07 ENCOUNTER — Other Ambulatory Visit: Payer: Self-pay

## 2019-05-07 VITALS — BP 118/76 | HR 66 | Temp 98.2°F | Ht 70.0 in | Wt 214.6 lb

## 2019-05-07 DIAGNOSIS — Z13220 Encounter for screening for lipoid disorders: Secondary | ICD-10-CM

## 2019-05-07 DIAGNOSIS — R079 Chest pain, unspecified: Secondary | ICD-10-CM

## 2019-05-07 NOTE — Patient Instructions (Signed)
Medication Instructions:  Your physician recommends that you continue on your current medications as directed. Please refer to the Current Medication list given to you today.  *If you need a refill on your cardiac medications before your next appointment, please call your pharmacy*  Lab Work: Your physician recommends that you return for lab work in:   [place]   If you have labs (blood work) drawn today and your tests are completely normal, you will receive your results only by: Marland Kitchen MyChart Message (if you have MyChart) OR . A paper copy in the mail If you have any lab test that is abnormal or we need to change your treatment, we will call you to review the results.  Testing/Procedures: Your physician has requested that you have an exercise tolerance test. For further information please visit [email or web address]. Please also follow instruction sheet, as given.  YOU WILL NEED A COVID-19 TEST 3-4 DAYS PRIOR TO YOUR EXERCISE TOLERANCE TEST. THIS WILL BE SCHEDULED AFTER YOUR EXERCISE TOLERANCE TEST HAS BEEN SCHEDULED.  Follow-Up: At [facility], you and your health needs are our priority.  As part of our continuing mission to provide you with exceptional heart care, we have created designated Provider Care Teams.  These Care Teams include your primary Cardiologist (physician) and Advanced Practice Providers (APPs -  Physician Assistants and Nurse Practitioners) who all work together to provide you with the care you need, when you need it.  Your next appointment:   AS NEEDED The format for your next appointment:   IN PERSON/VIRTUAL  Provider:   Minus Breeding, MD

## 2019-05-12 DIAGNOSIS — Z13220 Encounter for screening for lipoid disorders: Secondary | ICD-10-CM | POA: Diagnosis not present

## 2019-05-12 DIAGNOSIS — R079 Chest pain, unspecified: Secondary | ICD-10-CM | POA: Diagnosis not present

## 2019-05-12 LAB — CBC
Hematocrit: 43 % (ref 37.5–51.0)
Hemoglobin: 15.4 g/dL (ref 13.0–17.7)
MCH: 30.7 pg (ref 26.6–33.0)
MCHC: 35.8 g/dL — ABNORMAL HIGH (ref 31.5–35.7)
MCV: 86 fL (ref 79–97)
Platelets: 188 10*3/uL (ref 150–450)
RBC: 5.01 x10E6/uL (ref 4.14–5.80)
RDW: 11.9 % (ref 11.6–15.4)
WBC: 5.4 10*3/uL (ref 3.4–10.8)

## 2019-05-12 LAB — LIPID PANEL
Chol/HDL Ratio: 3.9 ratio (ref 0.0–5.0)
Cholesterol, Total: 192 mg/dL (ref 100–199)
HDL: 49 mg/dL (ref >39)
LDL Chol Calc (NIH): 123 mg/dL — ABNORMAL HIGH (ref 0–99)
Triglycerides: 112 mg/dL (ref 0–149)
VLDL Cholesterol Cal: 20 mg/dL (ref 5–40)

## 2019-05-12 LAB — TSH: TSH: 1.62 u[IU]/mL (ref 0.450–4.500)

## 2019-06-11 ENCOUNTER — Telehealth (HOSPITAL_COMMUNITY): Payer: Self-pay

## 2019-06-11 NOTE — Telephone Encounter (Signed)
Encounter complete. 

## 2019-06-12 ENCOUNTER — Inpatient Hospital Stay (HOSPITAL_COMMUNITY)
Admission: RE | Admit: 2019-06-12 | Discharge: 2019-06-12 | Disposition: A | Discharge status: Home / Self Care | Payer: BC Managed Care – PPO | Source: Ambulatory Visit

## 2019-06-12 NOTE — Progress Notes (Signed)
Pt called to not show up for covid testing today, as he tested positive on 03/23/2019. That puts him within the 90 day window to not retest, based on the current guidelines. Pt can still have his scheduled procedure.

## 2019-06-13 ENCOUNTER — Other Ambulatory Visit (HOSPITAL_COMMUNITY): Payer: BC Managed Care – PPO

## 2019-06-16 ENCOUNTER — Ambulatory Visit (HOSPITAL_COMMUNITY)
Admission: RE | Admit: 2019-06-16 | Discharge: 2019-06-16 | Disposition: A | Discharge status: Home / Self Care | Payer: BC Managed Care – PPO | Source: Ambulatory Visit | Attending: Cardiovascular Disease | Admitting: Cardiovascular Disease

## 2019-06-16 ENCOUNTER — Ambulatory Visit (INDEPENDENT_AMBULATORY_CARE_PROVIDER_SITE_OTHER)
Admission: RE | Admit: 2019-06-16 | Discharge: 2019-06-16 | Disposition: A | Discharge status: Home / Self Care | Payer: Self-pay | Source: Ambulatory Visit | Attending: Cardiology | Admitting: Cardiology

## 2019-06-16 ENCOUNTER — Other Ambulatory Visit: Payer: Self-pay

## 2019-06-16 DIAGNOSIS — R079 Chest pain, unspecified: Secondary | ICD-10-CM | POA: Diagnosis not present

## 2019-06-16 LAB — EXERCISE TOLERANCE TEST
Estimated workload: 12.6 METS
Exercise duration (min): 10 min
Exercise duration (sec): 36 s
MPHR: 169 {beats}/min
Peak HR: 162 {beats}/min
Percent HR: 95 %
Rest HR: 60 {beats}/min

## 2019-06-17 ENCOUNTER — Telehealth: Payer: Self-pay | Admitting: Cardiology

## 2019-06-17 NOTE — Telephone Encounter (Signed)
New Message     Carl Kim is calling for the pt , she says they gotten his results on Mychart and they have concerns    Please call

## 2019-06-17 NOTE — H&P (View-Only) (Signed)
Cardiology Office Note:    Date:  06/18/2019   ID:  Carl Kim, DOB 05-30-1966, MRN [id number]  PCP:  Roderick Pee, PA  Cardiologist:  Rollene Rotunda, MD   Referring MD: Roderick Pee, PA   Chief Complaint  Patient presents with  . Coronary Artery Disease    History of Present Illness:    Carl Kim is a 54 y.o. male with a hx of hypertension re-established cardiac care with Dr. Antoine Poche last month. He has a history of negative stress echo in 2012. He reported sporadic chest pain that was non-exertional. He underwent calcium score by CT which revealed calcification in the LAD and a calcium score of 42 which placed him in the 78th percentile for age and sex matched controls. He underwent POET which was abnormal revealing ST depressions in inferior and lateral leads. He is now presents for pre-catheterization evaluation.  I answered several questions regarding his calcium scoring and ETT.  He continues to have chest pain at rest.  I have advised him to rest at home until his heart cath. He agrees to proceed with heart cath. He does not have a history of contrast allergy.    Past Medical History:  Diagnosis Date  . Chest discomfort 2014   due to food poisoning/went to hospital per EMS  . Hypertension     Past Surgical History:  Procedure Laterality Date  . CERVICAL DISCECTOMY  2016   C4-5  . WRIST SURGERY     Left    Current Medications: Current Meds  Medication Sig  . aspirin EC 81 MG tablet Take 81 mg by mouth daily.  Marland Kitchen ibuprofen (ADVIL) 200 MG tablet Take 400 mg by mouth daily.    Marland Kitchen lisinopril-hydrochlorothiazide (ZESTORETIC) 10-12.5 MG tablet Take 1 tablet by mouth daily.  . [DISCONTINUED] lisinopril-hydrochlorothiazide (PRINZIDE,ZESTORETIC) 10-12.5 MG tablet Take 1 tablet by mouth daily.   Current Facility-Administered Medications for the 06/18/19 encounter (Office Visit) with Marcelino Duster, PA  Medication  . 0.9 %  sodium chloride infusion      Allergies:   Patient has no known allergies.   Social History   Socioeconomic History  . Marital status: Married    Spouse name: Not on file  . Number of children: 2  . Years of education: Not on file  . Highest education level: Not on file  Occupational History  . Occupation: Runner, broadcasting/film/video  Tobacco Use  . Smoking status: Never Smoker  . Smokeless tobacco: Never Used  Substance and Sexual Activity  . Alcohol use: Yes    Alcohol/week: 10.0 - 12.0 standard drinks    Types: 10 - 12 Cans of beer per week  . Drug use: Not on file  . Sexual activity: Not on file  Other Topics Concern  . Not on file  Social History Narrative  . Not on file   Social Determinants of Health   Financial Resource Strain:   . Difficulty of Paying Living Expenses: Not on file  Food Insecurity:   . Worried About Programme researcher, broadcasting/film/video in the Last Year: Not on file  . Ran Out of Food in the Last Year: Not on file  Transportation Needs:   . Lack of Transportation (Medical): Not on file  . Lack of Transportation (Non-Medical): Not on file  Physical Activity:   . Days of Exercise per Week: Not on file  . Minutes of Exercise per Session: Not on file  Stress:   . Feeling of Stress :  Not on file  Social Connections:   . Frequency of Communication with Friends and Family: Not on file  . Frequency of Social Gatherings with Friends and Family: Not on file  . Attends Religious Services: Not on file  . Active Member of Clubs or Organizations: Not on file  . Attends Archivist Meetings: Not on file  . Marital Status: Not on file     Family History: The patient's family history includes Diabetes in his brother and father; Leukemia in his father. There is no history of Colon cancer, Stomach cancer, or Rectal cancer.  ROS:   Please see the history of present illness.     All other systems reviewed and are negative.  EKGs/Labs/Other Studies Reviewed:    The following studies were reviewed  today:  ETT 06/16/19:  Horizontal ST segment depression ST segment depression of 3 mm was noted during stress in the II, III, aVF, V6, V5 and V4 leads, and returning to baseline after 1-5 minutes of recovery.  Blood pressure demonstrated a normal response to exercise.  The patient experienced non-limiting angina during the stress test  The test was stopped because the patient complained of fatigue and shortness of breath  Overall, the patient's exercise capacity was normal  Clayson Riling Treadmill Score: intermediate risk   2-3 mm horizontal ST depression in inferior and lateral leads at peak exercise. Chest discomfort and shortness of breath was noted. Findings consistent with ischemia. Further coronary evaluation is likely warranted.   Calcium scoring 06/16/19: Coronary arteries: Normal coronary origins. Coronary calcifications in the mid and distal LAD.  IMPRESSION: Coronary calcium score of 42. This was 78th percentile for age and sex matched control.  EKG:  EKG is ordered today.  The ekg ordered today demonstrates sinus rhythm with HR 66  Recent Labs: 05/12/2019: Hemoglobin 15.4; Platelets 188; TSH 1.620  Recent Lipid Panel    Component Value Date/Time   CHOL 192 05/12/2019 0821   TRIG 112 05/12/2019 0821   HDL 49 05/12/2019 0821   CHOLHDL 3.9 05/12/2019 0821   LDLCALC 123 (H) 05/12/2019 0821    Physical Exam:    VS:  BP (!) 128/93   Pulse 66   Temp 97.7 F (36.5 C)   Ht 5\' 10"  (1.778 m)   Wt 208 lb 12.8 oz (94.7 kg)   SpO2 99%   BMI 29.96 kg/m     Wt Readings from Last 3 Encounters:  06/18/19 208 lb 12.8 oz (94.7 kg)  05/07/19 214 lb 9.6 oz (97.3 kg)  12/18/17 210 lb (95.3 kg)     GEN:  Well nourished, well developed in no acute distress HEENT: Normal NECK: No JVD; No carotid bruits LYMPHATICS: No lymphadenopathy CARDIAC: RRR, no murmurs, rubs, gallops RESPIRATORY:  Clear to auscultation without rales, wheezing or rhonchi  ABDOMEN: Soft, non-tender,  non-distended MUSCULOSKELETAL:  No edema; No deformity  SKIN: Warm and dry NEUROLOGIC:  Alert and oriented x 3 PSYCHIATRIC:  Normal affect   ASSESSMENT:    1. Chest pain of uncertain etiology   2. Hypertension, unspecified type   3. Hyperlipidemia, unspecified hyperlipidemia type   4. Coronary artery disease involving native heart with unstable angina pectoris, unspecified vessel or lesion type [facility])    PLAN:    In order of problems listed above:  Chest pain Abnormal ETT Coronary calcification on CT - continue ASA - scheduled heart cath on 06/22/19   Hypertension - continue lisinopril-HCTZ - no change, pressure well controlled   Hyperlipidemia 05/12/2019:  Cholesterol, Total 192; HDL 49; LDL Chol Calc (NIH) 123; Triglycerides 112 - I will start 10 mg crestor, he will increase to 20 mg crestor in 3 days - father has a history of statin intolerance   The patient understands that risks included but are not limited to stroke (1 in 1000), death (1 in 1000), kidney failure [usually temporary] (1 in 500), bleeding (1 in 200), allergic reaction [possibly serious] (1 in 200).     Medication Adjustments/Labs and Tests Ordered: Current medicines are reviewed at length with the patient today.  Concerns regarding medicines are outlined above.  Orders Placed This Encounter  Procedures  . Basic metabolic panel  . CBC  . EKG 12-Lead   Meds ordered this encounter  Medications  . rosuvastatin (CRESTOR) 20 MG tablet    Sig: Take 1 tablet (20 mg total) by mouth daily.    Dispense:  30 tablet    Refill:  3  . nitroGLYCERIN (NITROSTAT) 0.4 MG SL tablet    Sig: Place 1 tablet (0.4 mg total) under the tongue every 5 (five) minutes as needed for chest pain.    Dispense:  25 tablet    Refill:  4  . lisinopril-hydrochlorothiazide (ZESTORETIC) 10-12.5 MG tablet    Sig: Take 1 tablet by mouth daily.    Dispense:  90 tablet    Refill:  1    Signed, Marcelino Duster, [place]  06/18/2019  10:20 AM    [facility]

## 2019-06-17 NOTE — Progress Notes (Signed)
Cardiology Office Note:    Date:  06/18/2019   ID:  [name], DOB 05-30-1966, MRN [id number]  PCP:  Roderick Pee, PA  Cardiologist:  Rollene Rotunda, MD   Referring MD: Roderick Pee, PA   Chief Complaint  Patient presents with  . Coronary Artery Disease    History of Present Illness:    KYI ROMANELLO is a 54 y.o. male with a hx of hypertension re-established cardiac care with Dr. Antoine Poche last month. He has a history of negative stress echo in 2012. He reported sporadic chest pain that was non-exertional. He underwent calcium score by CT which revealed calcification in the LAD and a calcium score of 42 which placed him in the 78th percentile for age and sex matched controls. He underwent POET which was abnormal revealing ST depressions in inferior and lateral leads. He is now presents for pre-catheterization evaluation.  I answered several questions regarding his calcium scoring and ETT.  He continues to have chest pain at rest.  I have advised him to rest at home until his heart cath. He agrees to proceed with heart cath. He does not have a history of contrast allergy.    Past Medical History:  Diagnosis Date  . Chest discomfort 2014   due to food poisoning/went to hospital per EMS  . Hypertension     Past Surgical History:  Procedure Laterality Date  . CERVICAL DISCECTOMY  2016   C4-5  . WRIST SURGERY     Left    Current Medications: Current Meds  Medication Sig  . aspirin EC 81 MG tablet Take 81 mg by mouth daily.  Marland Kitchen ibuprofen (ADVIL) 200 MG tablet Take 400 mg by mouth daily.    Marland Kitchen lisinopril-hydrochlorothiazide (ZESTORETIC) 10-12.5 MG tablet Take 1 tablet by mouth daily.  . [DISCONTINUED] lisinopril-hydrochlorothiazide (PRINZIDE,ZESTORETIC) 10-12.5 MG tablet Take 1 tablet by mouth daily.   Current Facility-Administered Medications for the 06/18/19 encounter (Office Visit) with Marcelino Duster, PA  Medication  . 0.9 %  sodium chloride infusion      Allergies:   Patient has no known allergies.   Social History   Socioeconomic History  . Marital status: Married    Spouse name: Not on file  . Number of children: 2  . Years of education: Not on file  . Highest education level: Not on file  Occupational History  . Occupation: Runner, broadcasting/film/video  Tobacco Use  . Smoking status: Never Smoker  . Smokeless tobacco: Never Used  Substance and Sexual Activity  . Alcohol use: Yes    Alcohol/week: 10.0 - 12.0 standard drinks    Types: 10 - 12 Cans of beer per week  . Drug use: Not on file  . Sexual activity: Not on file  Other Topics Concern  . Not on file  Social History Narrative  . Not on file   Social Determinants of Health   Financial Resource Strain:   . Difficulty of Paying Living Expenses: Not on file  Food Insecurity:   . Worried About Programme researcher, broadcasting/film/video in the Last Year: Not on file  . Ran Out of Food in the Last Year: Not on file  Transportation Needs:   . Lack of Transportation (Medical): Not on file  . Lack of Transportation (Non-Medical): Not on file  Physical Activity:   . Days of Exercise per Week: Not on file  . Minutes of Exercise per Session: Not on file  Stress:   . Feeling of Stress :  Not on file  Social Connections:   . Frequency of Communication with Friends and Family: Not on file  . Frequency of Social Gatherings with Friends and Family: Not on file  . Attends Religious Services: Not on file  . Active Member of Clubs or Organizations: Not on file  . Attends Archivist Meetings: Not on file  . Marital Status: Not on file     Family History: The patient's family history includes Diabetes in his brother and father; Leukemia in his father. There is no history of Colon cancer, Stomach cancer, or Rectal cancer.  ROS:   Please see the history of present illness.     All other systems reviewed and are negative.  EKGs/Labs/Other Studies Reviewed:    The following studies were reviewed  today:  ETT 06/16/19:  Horizontal ST segment depression ST segment depression of 3 mm was noted during stress in the II, III, aVF, V6, V5 and V4 leads, and returning to baseline after 1-5 minutes of recovery.  Blood pressure demonstrated a normal response to exercise.  The patient experienced non-limiting angina during the stress test  The test was stopped because the patient complained of fatigue and shortness of breath  Overall, the patient's exercise capacity was normal  Nikola Blackston Treadmill Score: intermediate risk   2-3 mm horizontal ST depression in inferior and lateral leads at peak exercise. Chest discomfort and shortness of breath was noted. Findings consistent with ischemia. Further coronary evaluation is likely warranted.   Calcium scoring 06/16/19: Coronary arteries: Normal coronary origins. Coronary calcifications in the mid and distal LAD.  IMPRESSION: Coronary calcium score of 42. This was 78th percentile for age and sex matched control.  EKG:  EKG is ordered today.  The ekg ordered today demonstrates sinus rhythm with HR 66  Recent Labs: 05/12/2019: Hemoglobin 15.4; Platelets 188; TSH 1.620  Recent Lipid Panel    Component Value Date/Time   CHOL 192 05/12/2019 0821   TRIG 112 05/12/2019 0821   HDL 49 05/12/2019 0821   CHOLHDL 3.9 05/12/2019 0821   LDLCALC 123 (H) 05/12/2019 0821    Physical Exam:    VS:  BP (!) 128/93   Pulse 66   Temp 97.7 F (36.5 C)   Ht 5\' 10"  (1.778 m)   Wt 208 lb 12.8 oz (94.7 kg)   SpO2 99%   BMI 29.96 kg/m     Wt Readings from Last 3 Encounters:  06/18/19 208 lb 12.8 oz (94.7 kg)  05/07/19 214 lb 9.6 oz (97.3 kg)  12/18/17 210 lb (95.3 kg)     GEN:  Well nourished, well developed in no acute distress HEENT: Normal NECK: No JVD; No carotid bruits LYMPHATICS: No lymphadenopathy CARDIAC: RRR, no murmurs, rubs, gallops RESPIRATORY:  Clear to auscultation without rales, wheezing or rhonchi  ABDOMEN: Soft, non-tender,  non-distended MUSCULOSKELETAL:  No edema; No deformity  SKIN: Warm and dry NEUROLOGIC:  Alert and oriented x 3 PSYCHIATRIC:  Normal affect   ASSESSMENT:    1. Chest pain of uncertain etiology   2. Hypertension, unspecified type   3. Hyperlipidemia, unspecified hyperlipidemia type   4. Coronary artery disease involving native heart with unstable angina pectoris, unspecified vessel or lesion type [facility])    PLAN:    In order of problems listed above:  Chest pain Abnormal ETT Coronary calcification on CT - continue ASA - scheduled heart cath on 06/22/19   Hypertension - continue lisinopril-HCTZ - no change, pressure well controlled   Hyperlipidemia 05/12/2019:  Cholesterol, Total 192; HDL 49; LDL Chol Calc (NIH) 123; Triglycerides 112 - I will start 10 mg crestor, he will increase to 20 mg crestor in 3 days - father has a history of statin intolerance   The patient understands that risks included but are not limited to stroke (1 in 1000), death (1 in 1000), kidney failure [usually temporary] (1 in 500), bleeding (1 in 200), allergic reaction [possibly serious] (1 in 200).     Medication Adjustments/Labs and Tests Ordered: Current medicines are reviewed at length with the patient today.  Concerns regarding medicines are outlined above.  Orders Placed This Encounter  Procedures  . Basic metabolic panel  . CBC  . EKG 12-Lead   Meds ordered this encounter  Medications  . rosuvastatin (CRESTOR) 20 MG tablet    Sig: Take 1 tablet (20 mg total) by mouth daily.    Dispense:  30 tablet    Refill:  3  . nitroGLYCERIN (NITROSTAT) 0.4 MG SL tablet    Sig: Place 1 tablet (0.4 mg total) under the tongue every 5 (five) minutes as needed for chest pain.    Dispense:  25 tablet    Refill:  4  . lisinopril-hydrochlorothiazide (ZESTORETIC) 10-12.5 MG tablet    Sig: Take 1 tablet by mouth daily.    Dispense:  90 tablet    Refill:  1    Signed, Irini Leet Nicole Oaklie Durrett, PA  06/18/2019  10:20 AM    [facility] 

## 2019-06-17 NOTE — Telephone Encounter (Signed)
Pt advised that we will contact as soon as Dr. Antoine Poche is able to give his recommendations re: his stress test.. pt reports that his schedule is very flexible if he needs to have any further testing.

## 2019-06-18 ENCOUNTER — Encounter: Payer: Self-pay | Admitting: Physician Assistant

## 2019-06-18 ENCOUNTER — Other Ambulatory Visit: Payer: Self-pay

## 2019-06-18 ENCOUNTER — Other Ambulatory Visit (HOSPITAL_COMMUNITY)
Admission: RE | Admit: 2019-06-18 | Discharge: 2019-06-18 | Disposition: A | Discharge status: Home / Self Care | Payer: BC Managed Care – PPO | Source: Ambulatory Visit | Attending: Cardiology | Admitting: Cardiology

## 2019-06-18 ENCOUNTER — Telehealth: Payer: Self-pay | Admitting: *Deleted

## 2019-06-18 ENCOUNTER — Encounter: Payer: Self-pay | Admitting: Cardiology

## 2019-06-18 ENCOUNTER — Ambulatory Visit: Payer: BC Managed Care – PPO | Admitting: Physician Assistant

## 2019-06-18 VITALS — BP 128/93 | HR 66 | Temp 97.7°F | Ht 70.0 in | Wt 208.8 lb

## 2019-06-18 DIAGNOSIS — Z01812 Encounter for preprocedural laboratory examination: Secondary | ICD-10-CM | POA: Diagnosis not present

## 2019-06-18 DIAGNOSIS — I10 Essential (primary) hypertension: Secondary | ICD-10-CM

## 2019-06-18 DIAGNOSIS — E785 Hyperlipidemia, unspecified: Secondary | ICD-10-CM

## 2019-06-18 DIAGNOSIS — R079 Chest pain, unspecified: Secondary | ICD-10-CM

## 2019-06-18 DIAGNOSIS — Z20822 Contact with and (suspected) exposure to covid-19: Secondary | ICD-10-CM | POA: Diagnosis not present

## 2019-06-18 DIAGNOSIS — I25110 Atherosclerotic heart disease of native coronary artery with unstable angina pectoris: Secondary | ICD-10-CM | POA: Diagnosis not present

## 2019-06-18 MED ORDER — LISINOPRIL-HYDROCHLOROTHIAZIDE 10-12.5 MG PO TABS: 1.0000 | ORAL_TABLET | Freq: Every day | ORAL | 1 refills | Status: DC

## 2019-06-18 MED ORDER — ROSUVASTATIN CALCIUM 20 MG PO TABS: 20.0000 mg | ORAL_TABLET | Freq: Every day | ORAL | 3 refills | Status: DC

## 2019-06-18 MED ORDER — NITROGLYCERIN 0.4 MG SL SUBL: 0.4000 mg | SUBLINGUAL_TABLET | SUBLINGUAL | 4 refills | Status: DC | PRN

## 2019-06-18 NOTE — Telephone Encounter (Signed)
Patient has been informed of results and is scheduled for cath 1/18

## 2019-06-18 NOTE — Telephone Encounter (Signed)
I called pt and instructed him to hold lisinopril-HCT the morning of the procedure 06/22/19. He knows he can take all his other morning medications, including ASA 81 mg.

## 2019-06-18 NOTE — Patient Instructions (Signed)
Medication Instructions:   START Rosuvastatin (Crestor) 20 mg daily.  Nitroglycerin 0.4 mg SUBL tablets sent to pharmacy---take 1 tablet every 5 minutes as needed for chest pain. MAX: 3 doses.  *If you need a refill on your cardiac medications before your next appointment, please call your pharmacy*  Lab Work: Your physician recommends that you return for lab work today: BMET, CBC  If you have labs (blood work) drawn today and your tests are completely normal, you will receive your results only by: Marland Kitchen MyChart Message (if you have MyChart) OR . A paper copy in the mail If you have any lab test that is abnormal or we need to change your treatment, we will call you to review the results.  Testing/Procedures:  CATH scheduled on 1/18  Please go to [facility] for a Pre-procedure COVID test. This is scheduled at 11:15AM.  Follow-Up: At [facility], you and your health needs are our priority.  As part of our continuing mission to provide you with exceptional heart care, we have created designated Provider Care Teams.  These Care Teams include your primary Cardiologist (physician) and Advanced Practice Providers (APPs -  Physician Assistants and Nurse Practitioners) who all work together to provide you with the care you need, when you need it.  Your next appointment:   Thursday, 07/02/19 at 1:30 PM.  The format for your next appointment:   In Person  Provider:   Micah Flesher, PA

## 2019-06-19 ENCOUNTER — Other Ambulatory Visit (HOSPITAL_COMMUNITY): Payer: BC Managed Care – PPO

## 2019-06-19 ENCOUNTER — Ambulatory Visit: Payer: BC Managed Care – PPO | Admitting: General Practice

## 2019-06-19 LAB — CBC
Hematocrit: 45.6 % (ref 37.5–51.0)
Hemoglobin: 15.7 g/dL (ref 13.0–17.7)
MCH: 30.5 pg (ref 26.6–33.0)
MCHC: 34.4 g/dL (ref 31.5–35.7)
MCV: 89 fL (ref 79–97)
Platelets: 257 10*3/uL (ref 150–450)
RBC: 5.15 x10E6/uL (ref 4.14–5.80)
RDW: 12.5 % (ref 11.6–15.4)
WBC: 7.3 10*3/uL (ref 3.4–10.8)

## 2019-06-19 LAB — BASIC METABOLIC PANEL
BUN/Creatinine Ratio: 17 (ref 9–20)
BUN: 19 mg/dL (ref 6–24)
CO2: 29 mmol/L (ref 20–29)
Calcium: 9.9 mg/dL (ref 8.7–10.2)
Chloride: 99 mmol/L (ref 96–106)
Creatinine, Ser: 1.13 mg/dL (ref 0.76–1.27)
GFR calc Af Amer: 87 mL/min/{1.73_m2} (ref >59)
GFR calc non Af Amer: 75 mL/min/{1.73_m2} (ref >59)
Glucose: 75 mg/dL (ref 65–99)
Potassium: 5 mmol/L (ref 3.5–5.2)
Sodium: 141 mmol/L (ref 134–144)

## 2019-06-19 LAB — NOVEL CORONAVIRUS, NAA (HOSP ORDER, SEND-OUT TO REF LAB; TAT 18-24 HRS): SARS-CoV-2, NAA: NOT DETECTED

## 2019-06-22 ENCOUNTER — Other Ambulatory Visit: Payer: Self-pay

## 2019-06-22 ENCOUNTER — Encounter (HOSPITAL_COMMUNITY)
Admission: RE | Disposition: A | Discharge status: Home / Self Care | Payer: BC Managed Care – PPO | Source: Home / Self Care | Attending: Internal Medicine

## 2019-06-22 ENCOUNTER — Ambulatory Visit (HOSPITAL_COMMUNITY)
Admission: RE | Admit: 2019-06-22 | Discharge: 2019-06-22 | Disposition: A | Discharge status: Home / Self Care | Payer: BC Managed Care – PPO | Attending: Internal Medicine | Admitting: Internal Medicine

## 2019-06-22 ENCOUNTER — Telehealth: Payer: Self-pay

## 2019-06-22 DIAGNOSIS — Z95820 Peripheral vascular angioplasty status with implants and grafts: Secondary | ICD-10-CM

## 2019-06-22 DIAGNOSIS — I25110 Atherosclerotic heart disease of native coronary artery with unstable angina pectoris: Secondary | ICD-10-CM | POA: Insufficient documentation

## 2019-06-22 DIAGNOSIS — Z7982 Long term (current) use of aspirin: Secondary | ICD-10-CM | POA: Insufficient documentation

## 2019-06-22 DIAGNOSIS — Z79899 Other long term (current) drug therapy: Secondary | ICD-10-CM | POA: Diagnosis not present

## 2019-06-22 DIAGNOSIS — I200 Unstable angina: Secondary | ICD-10-CM | POA: Diagnosis present

## 2019-06-22 DIAGNOSIS — E785 Hyperlipidemia, unspecified: Secondary | ICD-10-CM | POA: Diagnosis not present

## 2019-06-22 DIAGNOSIS — R9439 Abnormal result of other cardiovascular function study: Secondary | ICD-10-CM | POA: Diagnosis present

## 2019-06-22 DIAGNOSIS — I10 Essential (primary) hypertension: Secondary | ICD-10-CM | POA: Diagnosis not present

## 2019-06-22 HISTORY — PX: LEFT HEART CATH AND CORONARY ANGIOGRAPHY: CATH118249

## 2019-06-22 HISTORY — PX: CORONARY STENT INTERVENTION: CATH118234

## 2019-06-22 LAB — POCT ACTIVATED CLOTTING TIME
Activated Clotting Time: 246 seconds
Activated Clotting Time: 268 seconds

## 2019-06-22 SURGERY — LEFT HEART CATH AND CORONARY ANGIOGRAPHY
Anesthesia: LOCAL

## 2019-06-22 MED ORDER — SODIUM CHLORIDE 0.9% FLUSH: 3.0000 mL | Freq: Two times a day (BID) | INTRAVENOUS | Status: DC

## 2019-06-22 MED ORDER — PRASUGREL HCL 10 MG PO TABS
ORAL_TABLET | ORAL | Status: DC | PRN
  Administered 2019-06-22: 60 mg via ORAL

## 2019-06-22 MED ORDER — LIDOCAINE HCL (PF) 1 % IJ SOLN
INTRAMUSCULAR | Status: DC | PRN
  Administered 2019-06-22: 2 mL via INTRADERMAL

## 2019-06-22 MED ORDER — IOHEXOL 350 MG/ML SOLN
INTRAVENOUS | Status: DC | PRN
  Administered 2019-06-22: 130 mL via INTRACARDIAC

## 2019-06-22 MED ORDER — CLOPIDOGREL BISULFATE 75 MG PO TABS: 75.0000 mg | ORAL_TABLET | Freq: Every day | ORAL | Status: DC

## 2019-06-22 MED ORDER — SODIUM CHLORIDE 0.9 % IV SOLN: 250.0000 mL | INTRAVENOUS | Status: DC | PRN

## 2019-06-22 MED ORDER — FENTANYL CITRATE (PF) 100 MCG/2ML IJ SOLN
INTRAMUSCULAR | Status: DC | PRN
  Administered 2019-06-22 (×2): 25 ug via INTRAVENOUS
  Administered 2019-06-22: 50 ug via INTRAVENOUS

## 2019-06-22 MED ORDER — VERAPAMIL HCL 2.5 MG/ML IV SOLN
INTRAVENOUS | Status: DC | PRN
  Administered 2019-06-22: 10 mL via INTRA_ARTERIAL

## 2019-06-22 MED ORDER — PRASUGREL HCL 10 MG PO TABS
ORAL_TABLET | ORAL | Status: AC
  Filled 2019-06-22: qty 1

## 2019-06-22 MED ORDER — NITROGLYCERIN 1 MG/10 ML FOR IR/CATH LAB
INTRA_ARTERIAL | Status: DC | PRN
  Administered 2019-06-22 (×2): 200 ug via INTRACORONARY

## 2019-06-22 MED ORDER — VERAPAMIL HCL 2.5 MG/ML IV SOLN
INTRAVENOUS | Status: AC
  Filled 2019-06-22: qty 2

## 2019-06-22 MED ORDER — NITROGLYCERIN 1 MG/10 ML FOR IR/CATH LAB
INTRA_ARTERIAL | Status: AC
  Filled 2019-06-22: qty 10

## 2019-06-22 MED ORDER — SODIUM CHLORIDE 0.9 % WEIGHT BASED INFUSION: 1.0000 mL/kg/h | INTRAVENOUS | Status: DC

## 2019-06-22 MED ORDER — MIDAZOLAM HCL 2 MG/2ML IJ SOLN
INTRAMUSCULAR | Status: AC
  Filled 2019-06-22: qty 2

## 2019-06-22 MED ORDER — LIDOCAINE HCL (PF) 1 % IJ SOLN
INTRAMUSCULAR | Status: AC
  Filled 2019-06-22: qty 30

## 2019-06-22 MED ORDER — MIDAZOLAM HCL 2 MG/2ML IJ SOLN
INTRAMUSCULAR | Status: DC | PRN
  Administered 2019-06-22 (×3): 1 mg via INTRAVENOUS

## 2019-06-22 MED ORDER — HEPARIN SODIUM (PORCINE) 1000 UNIT/ML IJ SOLN
INTRAMUSCULAR | Status: AC
  Filled 2019-06-22: qty 1

## 2019-06-22 MED ORDER — SODIUM CHLORIDE 0.9% FLUSH: 3.0000 mL | INTRAVENOUS | Status: DC | PRN

## 2019-06-22 MED ORDER — HEPARIN (PORCINE) IN NACL 1000-0.9 UT/500ML-% IV SOLN
INTRAVENOUS | Status: AC
  Filled 2019-06-22: qty 1000

## 2019-06-22 MED ORDER — ASPIRIN 81 MG PO CHEW: 81.0000 mg | CHEWABLE_TABLET | ORAL | Status: DC

## 2019-06-22 MED ORDER — ACETAMINOPHEN 325 MG PO TABS: 650.0000 mg | ORAL_TABLET | ORAL | Status: DC | PRN

## 2019-06-22 MED ORDER — HEPARIN (PORCINE) IN NACL 1000-0.9 UT/500ML-% IV SOLN
INTRAVENOUS | Status: DC | PRN
  Administered 2019-06-22 (×2): 500 mL

## 2019-06-22 MED ORDER — SODIUM CHLORIDE 0.9 % IV SOLN: INTRAVENOUS | Status: AC

## 2019-06-22 MED ORDER — FENTANYL CITRATE (PF) 100 MCG/2ML IJ SOLN
INTRAMUSCULAR | Status: AC
  Filled 2019-06-22: qty 2

## 2019-06-22 MED ORDER — ASPIRIN 81 MG PO CHEW: 81.0000 mg | CHEWABLE_TABLET | Freq: Every day | ORAL | Status: DC

## 2019-06-22 MED ORDER — SODIUM CHLORIDE 0.9 % WEIGHT BASED INFUSION
3.0000 mL/kg/h | INTRAVENOUS | Status: AC
  Administered 2019-06-22: 11:00:00 3 mL/kg/h via INTRAVENOUS

## 2019-06-22 MED ORDER — LABETALOL HCL 5 MG/ML IV SOLN: 10.0000 mg | INTRAVENOUS | Status: DC | PRN

## 2019-06-22 MED ORDER — CLOPIDOGREL BISULFATE 75 MG PO TABS: 75.0000 mg | ORAL_TABLET | Freq: Every day | ORAL | 0 refills | Status: DC

## 2019-06-22 MED ORDER — HYDRALAZINE HCL 20 MG/ML IJ SOLN: 10.0000 mg | INTRAMUSCULAR | Status: DC | PRN

## 2019-06-22 MED ORDER — ONDANSETRON HCL 4 MG/2ML IJ SOLN: 4.0000 mg | Freq: Four times a day (QID) | INTRAMUSCULAR | Status: DC | PRN

## 2019-06-22 MED ORDER — HEPARIN SODIUM (PORCINE) 1000 UNIT/ML IJ SOLN
INTRAMUSCULAR | Status: DC | PRN
  Administered 2019-06-22 (×2): 4500 [IU] via INTRAVENOUS
  Administered 2019-06-22 (×2): 3000 [IU] via INTRAVENOUS

## 2019-06-22 MED ORDER — CLOPIDOGREL BISULFATE 75 MG PO TABS: 75.0000 mg | ORAL_TABLET | Freq: Every day | ORAL | 3 refills | Status: DC

## 2019-06-22 MED FILL — CLOPIDOGREL 75 MG TABLET: 75 | 30 days supply | Qty: 30 | Fill #0

## 2019-06-22 SURGICAL SUPPLY — 21 items
BALLN SAPPHIRE 2.5X12 (BALLOONS) ×2
BALLN ~~LOC~~ EMERGE MR 3.0X20 (BALLOONS) ×2
BALLN ~~LOC~~ EUPHORA RX 3.5X8 (BALLOONS) ×2
BALLOON SAPPHIRE 2.5X12 (BALLOONS) IMPLANT
BALLOON ~~LOC~~ EMERGE MR 3.0X20 (BALLOONS) IMPLANT
BALLOON ~~LOC~~ EUPHORA RX 3.5X8 (BALLOONS) IMPLANT
CATH 5FR JL3.5 JR4 ANG PIG MP (CATHETERS) ×1 IMPLANT
CATH VISTA GUIDE 6FR XBLAD3.5 (CATHETERS) ×1 IMPLANT
DEVICE RAD COMP TR BAND LRG (VASCULAR PRODUCTS) ×1 IMPLANT
GLIDESHEATH SLEND A-KIT 6F 22G (SHEATH) ×1 IMPLANT
GUIDEWIRE INQWIRE 1.5J.035X260 (WIRE) IMPLANT
INQWIRE 1.5J .035X260CM (WIRE) ×2
KIT ENCORE 26 ADVANTAGE (KITS) ×1 IMPLANT
KIT HEART LEFT (KITS) ×2 IMPLANT
PACK CARDIAC CATHETERIZATION (CUSTOM PROCEDURE TRAY) ×2 IMPLANT
STENT SYNERGY XD 2.75X28 (Permanent Stent) IMPLANT
SYNERGY XD 2.75X28 (Permanent Stent) ×2 IMPLANT
TRANSDUCER W/STOPCOCK (MISCELLANEOUS) ×2 IMPLANT
TUBING CIL FLEX 10 FLL-RA (TUBING) ×2 IMPLANT
WIRE HI TORQ BMW 190CM (WIRE) ×1 IMPLANT
WIRE RUNTHROUGH .014X180CM (WIRE) ×1 IMPLANT

## 2019-06-22 NOTE — Discharge Summary (Addendum)
Discharge Summary    Patient ID: Carl Kim MRN: [id number]; DOB: [birth year]  Admit date: 06/22/2019 Discharge date: 06/22/2019  Primary Care Provider: Roderick Pee, PA  Primary Cardiologist: Rollene Rotunda, MD  Primary Electrophysiologist:  None   Discharge Diagnoses    Principal Problem:   Accelerating angina [facility]) Active Problems:   Abnormal stress test   S/P angioplasty with stent   Diagnostic Studies/Procedures    CORONARY STENT INTERVENTION  LEFT HEART CATH AND CORONARY ANGIOGRAPHY 06/22/2019   Conclusions: 1. Severe single-vessel coronary artery disease with sequential 90% and 40% mid LAD stenoses involving D2, which has 60% ostial stenosis.  Mild to moderate, non-obstructive disease was also noted in the distal LAD and large OM1 branch. 2. Normal left ventricular systolic function with mildly elevated filling pressure. 3. Successful PCI to mid LAD using Synergy XD 2.75 x 28 mm drug-eluting stent (postdilated proximally to 3.5 mm) with 0% residual stenosis and TIMI-3 flow.  Jailed D2 branch has 60% ostial stenosis with TIMI-3 flow.  Recommendations: 1. Dual antiplatelet therapy for at least 6 months.  The patient was loaded with prasugrel in the cath lab and should continue aspirin 81 mg daily and clopidogrel 75 mg daily after discharge. 2. Aggressive secondary prevention. 3. Anticipate same day discharge this evening if no post-cath complications.  Yvonne Kendall, MD   Diagnostic Dominance: Right  Intervention    _____________   History of Present Illness     Carl Kim is a 54 y.o. male with past medical history significant for hypertension. He has a history of normal stress test in 2012. He was referred to cardiology in December by primary care for evaluation of chest pain discomfort that was somewhat atypical. With his cardiovascular risk factors, he underwent exercise tolerance test which showed inferolateral ST depression along with chest  discomfort and shortness of breath. Findings consistent with ischemia.   He also had cardiac CTA for calcium scoring with calcium score of 42.   With abnormal findings, the patient was arranged to have outpatient cardiac cath.   Hospital Course     Consultants: None  The patient underwent left heart cath today with finding of severe single-vessel coronary artery disease with sequential 90% and 40% mid LAD stenosis involving the D2, which has 60% ostial stenosis.  He also had mild to moderate nonobstructive disease in the distal LAD and large OM1 branch.  He underwent successful PCI to the mid LAD using Synergy XD 2.75 x 28 mm drug-eluting stent. Jailed D2 branch has 60% ostial stenosis.  He is planned for dual antiplatelet therapy for at least 6 months.  The patient was loaded with prasugrel in the Cath Lab and should continue on aspirin 81 mg daily and clopidogrel 75 mg daily after discharge.  We will continue to focus on aggressive secondary prevention. Recent lipid panel in 05/2019 showed LDL of 123. He is being started on Crestor 20 mg daily and will need follow up lipid panel and LFTs in 6-8 weeks.   Patient has been seen by Dr. Okey Dupre today and deemed appropriate for same day PCI discharge. All follow up appointments have been scheduled. Discharge medications are listed below and are being filled per the transition of care pharmacy.   Did the patient have an acute coronary syndrome (MI, NSTEMI, STEMI, etc) this admission?:  No  Did the patient have a percutaneous coronary intervention (stent / angioplasty)?:  Yes.     Cath/PCI Registry Performance & Quality Measures: 1. Aspirin prescribed? - Yes 2. ADP Receptor Inhibitor (Plavix/Clopidogrel, Brilinta/Ticagrelor or Effient/Prasugrel) prescribed (includes medically managed patients)? - Yes 3. High Intensity Statin (Lipitor 40-80mg  or Crestor 20-40mg ) prescribed? - Yes 4. For EF <40%, was ACEI/ARB prescribed? -  Not Applicable (EF >/= 40%) 5. For EF <40%, Aldosterone Antagonist (Spironolactone or Eplerenone) prescribed? - Not Applicable (EF >/= 40%) 6. Cardiac Rehab Phase II ordered (Included Medically managed Patients)? - Yes   _____________  Discharge Vitals Blood pressure 122/68, pulse 73, temperature 98.7 F (37.1 C), temperature source Oral, resp. rate (!) 0, height 5\' 10"  (1.778 m), weight 92.1 kg, SpO2 98 %.  Filed Weights   06/22/19 1013  Weight: 92.1 kg    Labs & Radiologic Studies    CBC No results for input(s): WBC, NEUTROABS, HGB, HCT, MCV, PLT in the last 72 hours. Basic Metabolic Panel No results for input(s): NA, K, CL, CO2, GLUCOSE, BUN, CREATININE, CALCIUM, MG, PHOS in the last 72 hours. Liver Function Tests No results for input(s): AST, ALT, ALKPHOS, BILITOT, PROT, ALBUMIN in the last 72 hours. No results for input(s): LIPASE, AMYLASE in the last 72 hours. High Sensitivity Troponin:   No results for input(s): TROPONINIHS in the last 720 hours.  BNP Invalid input(s): POCBNP D-Dimer No results for input(s): DDIMER in the last 72 hours. Hemoglobin A1C No results for input(s): HGBA1C in the last 72 hours. Fasting Lipid Panel No results for input(s): CHOL, HDL, LDLCALC, TRIG, CHOLHDL, LDLDIRECT in the last 72 hours. Thyroid Function Tests No results for input(s): TSH, T4TOTAL, T3FREE, THYROIDAB in the last 72 hours.  Invalid input(s): FREET3 _____________  CARDIAC CATHETERIZATION  Result Date: 06/22/2019 Conclusions: 1. Severe single-vessel coronary artery disease with sequential 90% and 40% mid LAD stenoses involving D2, which has 60% ostial stenosis.  Mild to moderate, non-obstructive disease was also noted in the distal LAD and large OM1 branch. 2. Normal left ventricular systolic function with mildly elevated filling pressure. 3. Successful PCI to mid LAD using Synergy XD 2.75 x 28 mm drug-eluting stent (postdilated proximally to 3.5 mm) with 0% residual stenosis and  TIMI-3 flow.  Jailed D2 branch has 60% ostial stenosis with TIMI-3 flow. Recommendations: 1. Dual antiplatelet therapy for at least 6 months.  The patient was loaded with prasugrel in the cath lab and should continue aspirin 81 mg daily and clopidogrel 75 mg daily after discharge. 2. Aggressive secondary prevention. 3. Anticipate same day discharge this evening if no post-cath complications. 06/24/2019, MD [facility]   CT CARDIAC SCORING  Addendum Date: 06/16/2019   ADDENDUM REPORT: 06/16/2019 12:41 CLINICAL DATA:  Risk stratification EXAM: Coronary Calcium Score TECHNIQUE: The patient was scanned on a 08/14/2019 scanner. Axial non-contrast 3 mm slices were carried out through the heart. The data set was analyzed on a dedicated work station and scored using the Agatson method. FINDINGS: Non-cardiac: See separate report from [facility]. Ascending Aorta: Normal Caliber.  No calcifications. Pericardium: Normal Coronary arteries: Normal coronary origins. Coronary calcifications in the mid and distal LAD. IMPRESSION: Coronary calcium score of 42. This was 78th percentile for age and sex matched control. [facility] Electronically Signed   By: Armanda Magic   On: 06/16/2019 12:41   Result Date: 06/16/2019 EXAM: OVER-READ INTERPRETATION CT CHEST The following report is an over-read performed by radiologist Dr. 08/14/2019 of [facility]  on 06/16/2019. This over-read does not include interpretation of cardiac or coronary anatomy or pathology. The coronary calcium score interpretation by the cardiologist is attached. COMPARISON:  None. FINDINGS: Vascular: No visible atherosclerosis involving the thoracic or upper abdominal aorta. No evidence of aneurysm. Mediastinum/Nodes: No pathologic lymphadenopathy within the visualized mediastinum. Calcified lymph node adjacent to the distal esophagus just above the EG junction. Visualized esophagus normal in appearance. Lungs/Pleura:  Visualized lung parenchyma clear. Central bronchi patent without significant bronchial wall thickening. No pleural effusions. Upper Abdomen: Unremarkable for the unenhanced technique. Musculoskeletal: Mild lower thoracic spondylosis. IMPRESSION: No significant extracardiac findings. Electronically Signed: By: Evangeline Dakin M.D. On: 06/16/2019 09:26   EXERCISE TOLERANCE TEST (ETT)  Result Date: 06/16/2019  Horizontal ST segment depression ST segment depression of 3 mm was noted during stress in the II, III, aVF, V6, V5 and V4 leads, and returning to baseline after 1-5 minutes of recovery.  Blood pressure demonstrated a normal response to exercise.  The patient experienced non-limiting angina during the stress test  The test was stopped because the patient complained of fatigue and shortness of breath  Overall, the patient's exercise capacity was normal  [facility] Treadmill Score: intermediate risk  2-3 mm horizontal ST depression in inferior and lateral leads at peak exercise. Chest discomfort and shortness of breath was noted. Findings consistent with ischemia. Further coronary evaluation is likely warranted.   Disposition   Pt is being discharged home today in good condition.  Follow-up Plans & Appointments    Follow-up Information    Ledora Bottcher, PA Follow up.   Specialties: Physician Assistant, Cardiology, Radiology Why: Cardiology hospital follow up on 07/02/19 at 1:30. Please arrive 15 minutes early for check in.  Contact information: [address] [place] [address] [phone]          Discharge Instructions    Amb Referral to Cardiac Rehabilitation   Complete by: As directed    Diagnosis: Coronary Stents   After initial evaluation and assessments completed: Virtual Based Care may be provided alone or in conjunction with Phase 2 Cardiac Rehab based on patient barriers.: Yes      Discharge Medications   Allergies as of 06/22/2019   No Known  Allergies     Medication List    STOP taking these medications   Advil 200 MG tablet Generic drug: ibuprofen     TAKE these medications   aspirin EC 81 MG tablet Take 81 mg by mouth daily.   clopidogrel 75 MG tablet Commonly known as: Plavix Take 1 tablet (75 mg total) by mouth daily.   clopidogrel 75 MG tablet Commonly known as: Plavix Take 1 tablet (75 mg total) by mouth daily.   fluticasone 50 MCG/ACT nasal spray Commonly known as: FLONASE Place 1 spray into both nostrils daily as needed for allergies or rhinitis.   lisinopril-hydrochlorothiazide 10-12.5 MG tablet Commonly known as: ZESTORETIC Take 1 tablet by mouth daily.   multivitamin with minerals Tabs tablet Take 2 tablets by mouth daily.   nitroGLYCERIN 0.4 MG SL tablet Commonly known as: NITROSTAT Place 1 tablet (0.4 mg total) under the tongue every 5 (five) minutes as needed for chest pain.   rosuvastatin 20 MG tablet Commonly known as: CRESTOR Take 1 tablet (20 mg total) by mouth daily.          Outstanding Labs/Studies   Follow up lipid panel and LFTS in 6-8 weeks.   Duration of Discharge Encounter   Greater than 30 minutes  including physician time.  Signed, Berton Bon, NP 06/22/2019, 3:12 PM   I have independently seen and examined the patient and agree with the findings and plan, as documented in the PA/NP's note, with the following additions/changes.  Mr. Lindroth reports feeling notably better with improvement in chest discomfort s/p PCI.  Right radial cath site is clean without hematoma.  We will complete 6 hour post-PCI monitoring and plan for d/c home this evening and close outpatient follow-up.  Mr. Pina will need to continue DAPT for at least 6 months.  Clopidogrel has been provided with long term prescription also sent to his outpatient pharmacy.  Mr. Imbert should continue rosuvastatin for secondary prevention.  Yvonne Kendall, MD [facility]

## 2019-06-22 NOTE — Discharge Instructions (Signed)
Radial Site Care  This sheet gives you information about how to care for yourself after your procedure. Your health care provider may also give you more specific instructions. If you have problems or questions, contact your health care provider. What can I expect after the procedure? After the procedure, it is common to have:  Bruising and tenderness at the catheter insertion area. Follow these instructions at home: Medicines  Take over-the-counter and prescription medicines only as told by your health care provider. Insertion site care  Follow instructions from your health care provider about how to take care of your insertion site. Make sure you: ? Wash your hands with soap and water before you change your bandage (dressing). If soap and water are not available, use hand sanitizer. ? Change your dressing as told by your health care provider. ? Leave stitches (sutures), skin glue, or adhesive strips in place. These skin closures may need to stay in place for 2 weeks or longer. If adhesive strip edges start to loosen and curl up, you may trim the loose edges. Do not remove adhesive strips completely unless your health care provider tells you to do that.  Check your insertion site every day for signs of infection. Check for: ? Redness, swelling, or pain. ? Fluid or blood. ? Pus or a bad smell. ? Warmth.  Do not take baths, swim, or use a hot tub until your health care provider approves.  You may shower 24-48 hours after the procedure, or as directed by your health care provider. ? Remove the dressing and gently wash the site with plain soap and water. ? Pat the area dry with a clean towel. ? Do not rub the site. That could cause bleeding.  Do not apply powder or lotion to the site. Activity   For 24 hours after the procedure, or as directed by your health care provider: ? Do not flex or bend the affected arm. ? Do not push or pull heavy objects with the affected arm. ? Do not  drive yourself home from the hospital or clinic. You may drive 24 hours after the procedure unless your health care provider tells you not to. ? Do not operate machinery or power tools.  Do not lift anything that is heavier than 10 lb (4.5 kg), or the limit that you are told, until your health care provider says that it is safe.  Ask your health care provider when it is okay to: ? Return to work or school. ? Resume usual physical activities or sports. ? Resume sexual activity. General instructions  If the catheter site starts to bleed, raise your arm and put firm pressure on the site. If the bleeding does not stop, get help right away. This is a medical emergency.  If you went home on the same day as your procedure, a responsible adult should be with you for the first 24 hours after you arrive home.  Keep all follow-up visits as told by your health care provider. This is important. Contact a health care provider if:  You have a fever.  You have redness, swelling, or yellow drainage around your insertion site. Get help right away if:  You have unusual pain at the radial site.  The catheter insertion area swells very fast.  The insertion area is bleeding, and the bleeding does not stop when you hold steady pressure on the area.  Your arm or hand becomes pale, cool, tingly, or numb. These symptoms may represent a serious problem   that is an emergency. Do not wait to see if the symptoms will go away. Get medical help right away. Call your local emergency services (911 in the [place] Do not drive yourself to the hospital. Summary  After the procedure, it is common to have bruising and tenderness at the site.  Follow instructions from your health care provider about how to take care of your radial site wound. Check the wound every day for signs of infection.  Do not lift anything that is heavier than 10 lb (4.5 kg), or the limit that you are told, until your health care provider says  that it is safe. This information is not intended to replace advice given to you by your health care provider. Make sure you discuss any questions you have with your health care provider. Document Revised: 06/26/2017 Document Reviewed: 06/26/2017 Elsevier Patient Education  2020 [employer].  Information about your medication: Plavix (anti-platelet agent)  Generic Name (Brand): clopidogrel (Plavix), once daily medication  PURPOSE: You are taking this medication along with aspirin to lower your chance of having a heart attack, stroke, or blood clots in your heart stent. These can be fatal. Brilinta and aspirin help prevent platelets from sticking together and forming a clot that can block an artery or your stent.   Common SIDE EFFECTS you may experience include: bruising or bleeding more easily, shortness of breath  Do not stop taking PLAVIX without talking to the doctor who prescribes it for you. People who are treated with a stent and stop taking Plavix too soon, have a higher risk of getting a blood clot in the stent, having a heart attack, or dying. If you stop Plavix because of bleeding, or for other reasons, your risk of a heart attack or stroke may increase.   Tell all of your doctors and dentists that you are taking Plavix. They should talk to the doctor who prescribed Brilinta for you before you have any surgery or invasive procedure.   Contact your health care provider if you experience: severe or uncontrollable bleeding, pink/red/brown urine, vomiting blood or vomit that looks like "coffee grounds", red or black stools (looks like tar), coughing up blood or blood clots ----------------------------------------------------------------------------------------------------------------------  Information about your medication: Statin (cholesterol-lowering agent)  Generic Name (Brand): atorvastatin (Lipitor), pravastatin (Pravachol), rosuvastatin (Crestor), simvastatin (Zocor)  PURPOSE:  You are taking this medication to lower your "bad" cholesterol (LDL) and to prevent heart attacks and strokes. Statins can also raise your "good" cholesterol (HDL).  Common SIDE EFFECTS you may experience include: muscle pain or weakness (especially in the legs) and upset stomach.  Take your medication exactly as prescribed. Do not eat large amounts of grapefruit or grapefruit juice while taking this medication.  Contact your health care provider if you experience: severe muscle pain that does not improve, dark urine, or yellowing of your skin or eyes. ----------------------------------------------------------------------------------------------------------------------

## 2019-06-22 NOTE — Progress Notes (Signed)
[id number] Education completed with pt who voiced understanding. Stressed importance of plavix stent. Reviewed heart healthy food choices, walking for ex, CRP 2, NTG use,risk factors. Referred to [facility] CRP 2. Pt has just bought exercise equipment for home and receptive to virtual or telephone follow up. Pt is interested in participating in Virtual Cardiac and Pulmonary Rehab. Pt advised that Virtual Cardiac and Pulmonary Rehab is provided at no cost to the patient.  Checklist:  1. Pt has smart device  ie smartphone and/or ipad for downloading an app  Yes 2. Reliable internet/wifi service    Yes 3. Understands how to use their smartphone and navigate within an app.  Yes   Pt verbalized understanding and is in agreement.

## 2019-06-22 NOTE — Interval H&P Note (Signed)
History and Physical Interval Note:  06/22/2019 11:56 AM  Carl Kim  has presented today for cardiac catheterization, with the diagnosis of accelerating angina and abnormal stress test.  The various methods of treatment have been discussed with the patient and family. After consideration of risks, benefits and other options for treatment, the patient has consented to  Procedure(s): LEFT HEART CATH AND CORONARY ANGIOGRAPHY (N/A) as a surgical intervention.  The patient's history has been reviewed, patient examined, no change in status, stable for surgery.  I have reviewed the patient's chart and labs.  Questions were answered to the patient's satisfaction.    Cath Lab Visit (complete for each Cath Lab visit)  Clinical Evaluation Leading to the Procedure:   ACS: No.  Non-ACS:    Anginal Classification: CCS IV  Anti-ischemic medical therapy: No Therapy  Non-Invasive Test Results: Intermediate-risk stress test findings: cardiac mortality 1-3%/year  Prior CABG: No previous CABG  [name]

## 2019-06-22 NOTE — Telephone Encounter (Signed)
-----   Message from Rollene Rotunda, MD sent at 06/20/2019 10:27 AM EST ----- The patient is aware of the results.  Send to Roderick Pee, [place]

## 2019-06-26 NOTE — Progress Notes (Signed)
Cardiology Office Note:    Date:  07/02/2019   ID:  Carl Kim, DOB 03-13-1966, MRN [id number]  PCP:  Carl Pee, PA  Cardiologist:  Rollene Rotunda, MD   Referring MD: Carl Pee, PA   Chief Complaint  Patient presents with  . Hospitalization Follow-up    post PCI    History of Present Illness:    Carl Kim is a 54 y.o. male with a hx of hypertension re-established cardiac care with Dr. Antoine Kim last month. He has a history of negative stress echo in 2012. He reported sporadic chest pain that was non-exertional. He underwent calcium score by CT which revealed calcification in the LAD and a calcium score of 42 which placed him in the 78th percentile for age and sex matched controls. He underwent POET which was abnormal revealing ST depressions in inferior and lateral leads. I saw him for pre-cath evaluation. He underwent angiography on 06/22/19 which revealed sequential lesion I the mid LAD of 90% followed by 40% treated with DES, which jailed a D2 branch which had 60% ostial stenosis. He has residual disease in the distal LAD and large OM1 branch. He was discharged on ASA and plavix for at least 6 months. Crestor 20 mg started.   He presents today for follow up. He is exercising - bought a peloton bike and a treadmill. He is doing at least 1-2 miles every morning on the treadmill plus peloton workouts. He has mild DOE and twinges of chest pain during exercise that sound atypical and I suspect his early enthusiastic exercise program is likely resulting in some overexertion. EKG is nonischemic. He also reports palpitations that occur 2-3 times daily and can wake him from sleep. His heart feels like its "fluttering," which he has associated with stress. He also reports poor nighttime sleep, waking gasping for air, snoring, and daytime sleepiness (needs to take naps).  Unclear why he was discharged without a BB.    Past Medical History:  Diagnosis Date  . Chest discomfort 2014     due to food poisoning/went to hospital per EMS  . Hypertension     Past Surgical History:  Procedure Laterality Date  . CERVICAL DISCECTOMY  2016   C4-5  . CORONARY STENT INTERVENTION N/A 06/22/2019   Procedure: CORONARY STENT INTERVENTION;  Surgeon: Yvonne Kendall, MD;  Location: [facility] INVASIVE CV [facility];  Service: Cardiovascular;  Laterality: N/A;  . LEFT HEART CATH AND CORONARY ANGIOGRAPHY N/A 06/22/2019   Procedure: LEFT HEART CATH AND CORONARY ANGIOGRAPHY;  Surgeon: Yvonne Kendall, MD;  Location: [facility] INVASIVE CV LAB;  Service: Cardiovascular;  Laterality: N/A;  . WRIST SURGERY     Left    Current Medications: Current Meds  Medication Sig  . aspirin EC 81 MG tablet Take 81 mg by mouth daily.  . clopidogrel (PLAVIX) 75 MG tablet Take 1 tablet (75 mg total) by mouth daily.  . clopidogrel (PLAVIX) 75 MG tablet Take 1 tablet (75 mg total) by mouth daily.  . fluticasone (FLONASE) 50 MCG/ACT nasal spray Place 1 spray into both nostrils daily as needed for allergies or rhinitis.  Marland Kitchen lisinopril-hydrochlorothiazide (ZESTORETIC) 10-12.5 MG tablet Take 1 tablet by mouth daily.  . Multiple Vitamin (MULTIVITAMIN WITH MINERALS) TABS tablet Take 2 tablets by mouth daily.  . nitroGLYCERIN (NITROSTAT) 0.4 MG SL tablet Place 1 tablet (0.4 mg total) under the tongue every 5 (five) minutes as needed for chest pain.  . rosuvastatin (CRESTOR) 20 MG tablet Take 1 tablet (  20 mg total) by mouth daily.     Allergies:   Patient has no known allergies.   Social History   Socioeconomic History  . Marital status: Married    Spouse name: Not on file  . Number of children: 2  . Years of education: Not on file  . Highest education level: Not on file  Occupational History  . Occupation: Runner, broadcasting/film/video  Tobacco Use  . Smoking status: Never Smoker  . Smokeless tobacco: Never Used  Substance and Sexual Activity  . Alcohol use: Yes    Alcohol/week: 10.0 - 12.0 standard drinks    Types: 10 - 12 Cans of beer per  week  . Drug use: Not on file  . Sexual activity: Not on file  Other Topics Concern  . Not on file  Social History Narrative  . Not on file   Social Determinants of Health   Financial Resource Strain:   . Difficulty of Paying Living Expenses: Not on file  Food Insecurity:   . Worried About Programme researcher, broadcasting/film/video in the Last Year: Not on file  . Ran Out of Food in the Last Year: Not on file  Transportation Needs:   . Lack of Transportation (Medical): Not on file  . Lack of Transportation (Non-Medical): Not on file  Physical Activity:   . Days of Exercise per Week: Not on file  . Minutes of Exercise per Session: Not on file  Stress:   . Feeling of Stress : Not on file  Social Connections:   . Frequency of Communication with Friends and Family: Not on file  . Frequency of Social Gatherings with Friends and Family: Not on file  . Attends Religious Services: Not on file  . Active Member of Clubs or Organizations: Not on file  . Attends Banker Meetings: Not on file  . Marital Status: Not on file     Family History: The patient's family history includes Diabetes in his brother and father; Leukemia in his father. There is no history of Colon cancer, Stomach cancer, or Rectal cancer.  ROS:   Please see the history of present illness.     All other systems reviewed and are negative.  EKGs/Labs/Other Studies Reviewed:    The following studies were reviewed today:  CORONARY STENT INTERVENTION  LEFT HEART CATH AND CORONARY ANGIOGRAPHY 06/22/2019   Conclusions: 1. Severe single-vessel coronary artery disease with sequential 90% and 40% mid LAD stenoses involving D2, which has 60% ostial stenosis. Mild to moderate, non-obstructive disease was also noted in the distal LAD and large OM1 branch. 2. Normal left ventricular systolic function with mildly elevated filling pressure. 3. Successful PCI to mid LAD using Synergy XD 2.75 x 28 mm drug-eluting stent (postdilated  proximally to 3.5 mm) with 0% residual stenosis and TIMI-3 flow. Jailed D2 branch has 60% ostial stenosis with TIMI-3 flow.  Recommendations: 1. Dual antiplatelet therapy for at least 6 months. The patient was loaded with prasugrel in the cath lab and should continue aspirin 81 mg daily and clopidogrel 75 mg daily after discharge. 2. Aggressive secondary prevention. 3. Anticipate same day discharge this evening if no post-cath complications.  Yvonne Kendall, MD   Diagnostic Dominance: Right  Intervention      EKG:  EKG is ordered today.  The ekg ordered today demonstrates sinus rhythm HR 67  Recent Labs: 05/12/2019: TSH 1.620 06/18/2019: BUN 19; Creatinine, Ser 1.13; Hemoglobin 15.7; Platelets 257; Potassium 5.0; Sodium 141  Recent Lipid Panel  Component Value Date/Time   CHOL 192 05/12/2019 0821   TRIG 112 05/12/2019 0821   HDL 49 05/12/2019 0821   CHOLHDL 3.9 05/12/2019 0821   LDLCALC 123 (H) 05/12/2019 0821    Physical Exam:    VS:  BP 122/84   Pulse 67   Ht 5\' 10"  (1.778 m)   Wt 210 lb (95.3 kg)   BMI 30.13 kg/m     Wt Readings from Last 3 Encounters:  07/02/19 210 lb (95.3 kg)  06/22/19 203 lb (92.1 kg)  06/18/19 208 lb 12.8 oz (94.7 kg)     GEN: Well nourished, well developed in no acute distress HEENT: Normal NECK: No JVD; No carotid bruits LYMPHATICS: No lymphadenopathy CARDIAC: RRR, no murmurs, rubs, gallops RESPIRATORY:  Clear to auscultation without rales, wheezing or rhonchi  ABDOMEN: Soft, non-tender, non-distended MUSCULOSKELETAL:  No edema; No deformity  SKIN: Warm and dry NEUROLOGIC:  Alert and oriented x 3 PSYCHIATRIC:  Normal affect  Right radial cath site is C/D/I  ASSESSMENT:    1. S/P angioplasty with stent   2. Accelerating angina ([place])   3. Hypertension, unspecified type   4. Hyperlipidemia, unspecified hyperlipidemia type   5. Palpitations   6. Snoring   7. Daytime sleepiness   8. Gasping for breath    PLAN:     In order of problems listed above:  CAD s/p DES to LAD, jailed D2 - heart cath in response to coronary calcification and abnormal ETT - continue ASA and plavix for 6 months - will start 12.5 mg toprol at night - he is doing very well - no cardiac rehab as [facility] program is still closed - will continue to monitor symptoms during exercise   Hyperlipidemia with LDL goal < 70 05/12/2019: Cholesterol, Total 192; HDL 49; LDL Chol Calc (NIH) 123; Triglycerides 112 - crestor 20 mg started at discharge - will need fasting lipids in 6 weeks   Hypertension - continue lisinopril-HCTZ - pressure is well-controlled   Palpitations that wake him from sleep - will collect BMP, Mg, and TSH - will place a 14-day zio patch   Daytime fatigue Snoring Poor sleep Wakes up gasping for air - discussed OSA and recommended sleep study with Dr. Claiborne Billings   He prefers to see Dr. Percival Spanish in [place] instead of [place].   Follow up in 6 weeks.   Medication Adjustments/Labs and Tests Ordered: Current medicines are reviewed at length with the patient today.  Concerns regarding medicines are outlined above.  Orders Placed This Encounter  Procedures  . Basic metabolic panel  . Magnesium  . TSH  . Lipid panel  . LONG TERM MONITOR (3-14 DAYS)  . EKG 12-Lead  . Split night study   Meds ordered this encounter  Medications  . metoprolol succinate (TOPROL-XL) 25 MG 24 hr tablet    Sig: Take 0.5 tablets (12.5 mg total) by mouth daily.    Dispense:  45 tablet    Refill:  1    Signed, Ledora Bottcher, [place]  07/02/2019 2:59 PM    [facility]

## 2019-06-30 ENCOUNTER — Telehealth (HOSPITAL_COMMUNITY): Payer: Self-pay | Admitting: *Deleted

## 2019-06-30 NOTE — Telephone Encounter (Signed)
Called patient today to discuss and to introduce the cardiac program. Patient stated that he has decided to exercise at home. He feels that he is doing well on his own. Does not want the virtual app either. I shared that if he changes his mind we will be happy to get him into the program.

## 2019-07-02 ENCOUNTER — Ambulatory Visit: Payer: BC Managed Care – PPO | Admitting: Physician Assistant

## 2019-07-02 ENCOUNTER — Encounter: Payer: Self-pay | Admitting: Physician Assistant

## 2019-07-02 ENCOUNTER — Other Ambulatory Visit: Payer: Self-pay

## 2019-07-02 ENCOUNTER — Encounter: Payer: Self-pay | Admitting: *Deleted

## 2019-07-02 VITALS — BP 122/84 | HR 67 | Ht 70.0 in | Wt 210.0 lb

## 2019-07-02 DIAGNOSIS — Z95820 Peripheral vascular angioplasty status with implants and grafts: Secondary | ICD-10-CM | POA: Diagnosis not present

## 2019-07-02 DIAGNOSIS — I10 Essential (primary) hypertension: Secondary | ICD-10-CM | POA: Diagnosis not present

## 2019-07-02 DIAGNOSIS — R400 Somnolence: Secondary | ICD-10-CM

## 2019-07-02 DIAGNOSIS — E785 Hyperlipidemia, unspecified: Secondary | ICD-10-CM | POA: Diagnosis not present

## 2019-07-02 DIAGNOSIS — I200 Unstable angina: Secondary | ICD-10-CM

## 2019-07-02 DIAGNOSIS — R0689 Other abnormalities of breathing: Secondary | ICD-10-CM

## 2019-07-02 DIAGNOSIS — R002 Palpitations: Secondary | ICD-10-CM | POA: Insufficient documentation

## 2019-07-02 DIAGNOSIS — R0683 Snoring: Secondary | ICD-10-CM

## 2019-07-02 MED ORDER — METOPROLOL SUCCINATE ER 25 MG PO TB24: 12.5000 mg | ORAL_TABLET | Freq: Every day | ORAL | 1 refills | Status: DC

## 2019-07-02 NOTE — Progress Notes (Signed)
Patient ID: Carl Kim, male   DOB: 1966-03-05, 54 y.o.   MRN: [id number] Patient enrolled for [employer] to mail a 14 day ZIO XT long term holter monitor to the patients home.

## 2019-07-02 NOTE — Patient Instructions (Signed)
Medication Instructions:   START Metoprolol Succinate 25 mg--take 12.5 (1/2 tab) daily.  *If you need a refill on your cardiac medications before your next appointment, please call your pharmacy*  Lab Work: Your physician recommends that you return for lab work today: BMET, Magnesium, TSH  Your physician recommends that you return for a FASTING lipid profile in 6 weeks when you return for your follow-up appointment with Dr. Percival Spanish.   If you have labs (blood work) drawn today and your tests are completely normal, you will receive your results only by: Marland Kitchen MyChart Message (if you have MyChart) OR . A paper copy in the mail If you have any lab test that is abnormal or we need to change your treatment, we will call you to review the results.  Testing/Procedures:  Bryn Gulling- Long Term Monitor Instructions   Your physician has requested you wear your ZIO patch monitor_14__days.   This is a single patch monitor.  [employer] supplies one patch monitor per enrollment.  Additional stickers are not available.   Please do not apply patch if you will be having a Nuclear Stress Test, Echocardiogram, Cardiac CT, MRI, or Chest Xray during the time frame you would be wearing the monitor. The patch cannot be worn during these tests.  You cannot remove and re-apply the ZIO XT patch monitor.   Your ZIO patch monitor will be sent USPS Priority mail from [facility] directly to your home address. The monitor may also be mailed to a PO BOX if home delivery is not available.   It may take 3-5 days to receive your monitor after you have been enrolled.   Once you have received you monitor, please review enclosed instructions.  Your monitor has already been registered assigning a specific monitor serial # to you.   Applying the monitor   Shave hair from upper left chest.   Hold abrader disc by orange tab.  Rub abrader in 40 strokes over left upper chest as indicated in your monitor instructions.   Clean  area with 4 enclosed alcohol pads .  Use all pads to assure are is cleaned thoroughly.  Let dry.   Apply patch as indicated in monitor instructions.  Patch will be place under collarbone on left side of chest with arrow pointing upward.   Rub patch adhesive wings for 2 minutes.Remove white label marked "1".  Remove white label marked "2".  Rub patch adhesive wings for 2 additional minutes.   While looking in a mirror, press and release button in center of patch.  A small green light will flash 3-4 times .  This will be your only indicator the monitor has been turned on.     Do not shower for the first 24 hours.  You may shower after the first 24 hours.   Press button if you feel a symptom. You will hear a small click.  Record Date, Time and Symptom in the Patient Log Book.   When you are ready to remove patch, follow instructions on last 2 pages of Patient Log Book.  Stick patch monitor onto last page of Patient Log Book.   Place Patient Log Book in [place] box.  Use locking tab on box and tape box closed securely.  The [employer] has [employer] on it.  Please place in mailbox as soon as possible.  Your physician should have your test results approximately 7 days after the monitor has been mailed back to [facility].   Call  [employer] Customer Care at [phone] if you have questions regarding your ZIO XT patch monitor.  Call them immediately if you see an orange light blinking on your monitor.   If your monitor falls off in less than 4 days contact our Monitor department at [phone].  If your monitor becomes loose or falls off after 4 days call [employer] at [phone] for suggestions on securing your monitor.    Your physician has recommended that you have a sleep study. This test records several body functions during sleep, including: brain activity, eye movement, oxygen and carbon dioxide blood levels, heart rate and rhythm, breathing rate and rhythm, the flow of  air through your mouth and nose, snoring, body muscle movements, and chest and belly movement. Claiborne Rigg, CMA will call you to schedule this.  Follow-Up: At [facility], you and your health needs are our priority.  As part of our continuing mission to provide you with exceptional heart care, we have created designated Provider Care Teams.  These Care Teams include your primary Cardiologist (physician) and Advanced Practice Providers (APPs -  Physician Assistants and Nurse Practitioners) who all work together to provide you with the care you need, when you need it.  Your next appointment:    You have been scheduled for a follow-up appointment with Dr. Antoine Poche on Monday, 08/17/19 at 12:20 PM IN OFFICE--[address]

## 2019-07-03 LAB — BASIC METABOLIC PANEL
BUN/Creatinine Ratio: 21 — ABNORMAL HIGH (ref 9–20)
BUN: 25 mg/dL — ABNORMAL HIGH (ref 6–24)
CO2: 28 mmol/L (ref 20–29)
Calcium: 9.8 mg/dL (ref 8.7–10.2)
Chloride: 96 mmol/L (ref 96–106)
Creatinine, Ser: 1.2 mg/dL (ref 0.76–1.27)
GFR calc Af Amer: 80 mL/min/{1.73_m2} (ref >59)
GFR calc non Af Amer: 70 mL/min/{1.73_m2} (ref >59)
Glucose: 94 mg/dL (ref 65–99)
Potassium: 4 mmol/L (ref 3.5–5.2)
Sodium: 139 mmol/L (ref 134–144)

## 2019-07-03 LAB — TSH: TSH: 1.23 u[IU]/mL (ref 0.450–4.500)

## 2019-07-03 LAB — MAGNESIUM: Magnesium: 2.1 mg/dL (ref 1.6–2.3)

## 2019-07-08 ENCOUNTER — Ambulatory Visit (INDEPENDENT_AMBULATORY_CARE_PROVIDER_SITE_OTHER): Payer: BC Managed Care – PPO

## 2019-07-08 DIAGNOSIS — R002 Palpitations: Secondary | ICD-10-CM

## 2019-07-10 DIAGNOSIS — Z0000 Encounter for general adult medical examination without abnormal findings: Secondary | ICD-10-CM | POA: Diagnosis not present

## 2019-07-10 DIAGNOSIS — Z125 Encounter for screening for malignant neoplasm of prostate: Secondary | ICD-10-CM | POA: Diagnosis not present

## 2019-07-10 DIAGNOSIS — I2510 Atherosclerotic heart disease of native coronary artery without angina pectoris: Secondary | ICD-10-CM | POA: Diagnosis not present

## 2019-07-10 DIAGNOSIS — Z23 Encounter for immunization: Secondary | ICD-10-CM | POA: Diagnosis not present

## 2019-07-10 DIAGNOSIS — E7800 Pure hypercholesterolemia, unspecified: Secondary | ICD-10-CM | POA: Diagnosis not present

## 2019-07-10 DIAGNOSIS — F411 Generalized anxiety disorder: Secondary | ICD-10-CM | POA: Diagnosis not present

## 2019-07-10 DIAGNOSIS — I10 Essential (primary) hypertension: Secondary | ICD-10-CM | POA: Diagnosis not present

## 2019-07-23 DIAGNOSIS — R002 Palpitations: Secondary | ICD-10-CM | POA: Diagnosis not present

## 2019-07-29 ENCOUNTER — Other Ambulatory Visit: Payer: Self-pay

## 2019-07-29 ENCOUNTER — Encounter (HOSPITAL_COMMUNITY): Payer: Self-pay | Admitting: Emergency Medicine

## 2019-07-29 ENCOUNTER — Emergency Department (HOSPITAL_COMMUNITY): Payer: BC Managed Care – PPO

## 2019-07-29 ENCOUNTER — Observation Stay (HOSPITAL_COMMUNITY)
Admission: EM | Admit: 2019-07-29 | Discharge: 2019-07-30 | Disposition: A | Discharge status: Home / Self Care | Payer: BC Managed Care – PPO | Attending: Family Medicine | Admitting: Family Medicine

## 2019-07-29 DIAGNOSIS — E785 Hyperlipidemia, unspecified: Secondary | ICD-10-CM | POA: Diagnosis present

## 2019-07-29 DIAGNOSIS — I10 Essential (primary) hypertension: Secondary | ICD-10-CM | POA: Diagnosis present

## 2019-07-29 DIAGNOSIS — R079 Chest pain, unspecified: Secondary | ICD-10-CM | POA: Diagnosis not present

## 2019-07-29 DIAGNOSIS — Z20822 Contact with and (suspected) exposure to covid-19: Secondary | ICD-10-CM | POA: Diagnosis not present

## 2019-07-29 DIAGNOSIS — R0789 Other chest pain: Principal | ICD-10-CM | POA: Insufficient documentation

## 2019-07-29 DIAGNOSIS — Z95820 Peripheral vascular angioplasty status with implants and grafts: Secondary | ICD-10-CM

## 2019-07-29 DIAGNOSIS — R5381 Other malaise: Secondary | ICD-10-CM | POA: Diagnosis not present

## 2019-07-29 DIAGNOSIS — Z79899 Other long term (current) drug therapy: Secondary | ICD-10-CM | POA: Diagnosis not present

## 2019-07-29 DIAGNOSIS — R0602 Shortness of breath: Secondary | ICD-10-CM | POA: Diagnosis not present

## 2019-07-29 DIAGNOSIS — I2510 Atherosclerotic heart disease of native coronary artery without angina pectoris: Secondary | ICD-10-CM | POA: Diagnosis not present

## 2019-07-29 DIAGNOSIS — Z955 Presence of coronary angioplasty implant and graft: Secondary | ICD-10-CM | POA: Diagnosis not present

## 2019-07-29 DIAGNOSIS — Z7902 Long term (current) use of antithrombotics/antiplatelets: Secondary | ICD-10-CM | POA: Insufficient documentation

## 2019-07-29 DIAGNOSIS — Z7982 Long term (current) use of aspirin: Secondary | ICD-10-CM | POA: Diagnosis not present

## 2019-07-29 DIAGNOSIS — R1013 Epigastric pain: Secondary | ICD-10-CM | POA: Diagnosis not present

## 2019-07-29 DIAGNOSIS — R69 Illness, unspecified: Secondary | ICD-10-CM | POA: Diagnosis not present

## 2019-07-29 LAB — TROPONIN I (HIGH SENSITIVITY)
Troponin I (High Sensitivity): <2 ng/L (ref <18)
Troponin I (High Sensitivity): <2 ng/L (ref <18)

## 2019-07-29 LAB — BASIC METABOLIC PANEL
Anion gap: 10 (ref 5–15)
BUN: 18 mg/dL (ref 6–20)
CO2: 29 mmol/L (ref 22–32)
Calcium: 9.3 mg/dL (ref 8.9–10.3)
Chloride: 98 mmol/L (ref 98–111)
Creatinine, Ser: 1.04 mg/dL (ref 0.61–1.24)
GFR calc Af Amer: >60 mL/min (ref >60)
GFR calc non Af Amer: >60 mL/min (ref >60)
Glucose, Bld: 86 mg/dL (ref 70–99)
Potassium: 3.6 mmol/L (ref 3.5–5.1)
Sodium: 137 mmol/L (ref 135–145)

## 2019-07-29 LAB — CBC
HCT: 40 % (ref 39.0–52.0)
Hemoglobin: 13.3 g/dL (ref 13.0–17.0)
MCH: 30.3 pg (ref 26.0–34.0)
MCHC: 33.3 g/dL (ref 30.0–36.0)
MCV: 91.1 fL (ref 80.0–100.0)
Platelets: 210 10*3/uL (ref 150–400)
RBC: 4.39 MIL/uL (ref 4.22–5.81)
RDW: 12.6 % (ref 11.5–15.5)
WBC: 6.3 10*3/uL (ref 4.0–10.5)
nRBC: 0 % (ref 0.0–0.2)

## 2019-07-29 NOTE — H&P (Signed)
History and Physical    Patient Demographics:    Carl Kim HER:[id number] DOB: 12-17-1965 DOA: 07/29/2019  PCP: Roderick Pee, PA  Patient coming from: Home  I have personally briefly reviewed patient's old medical records in [facility]  Chief Complaint: Chest pain   Assessment & Plan:     Assessment/Plan Principal Problem:   Chest pain of uncertain etiology Active Problems:   Hypertension   Hyperlipidemia   S/P angioplasty with stent     Principal Problem: Chest pain, concerning for unstable angina Patient presented with left-sided stabbing chest pain intermittently for 3 days.  Worsened episode of chest pain today.  Pain improved with nitroglycerin and aspirin.  Does report a positive history of working out prior to the onset of the pain 4 days ago.  Possibly musculoskeletal but do have concern for unstable angina.  EKG on presentation does show some mild ST elevations in inferior and lateral leads.  Case was discussed with Dr. Gilmore Laroche from cardiology service at [facility] who did not believe this represented acute MI and did not feel the patient required transfer to [facility]. -Telemetry monitoring -Serial troponins -Nitro, morphine as needed -We will plan for cardiology consult in a.m.  Other Active Problems: Coronary artery disease status post stent placement: Patient had history of intermittent chest pain in January 2021. He underwent angiography on 06/22/19 which revealed sequential lesion I the mid LAD of 90% followed by 40% treated with DES, which jailed a D2 branch which had 60% ostial stenosis. He has residual disease in the distal LAD and large OM1 branch. He was discharged on ASA and plavix for at least 6 months.  -Continue aspirin, Plavix  Hypertension: -Continue metoprolol, lisinopril, hydrochlorothiazide  Hyperlipidemia: -Continue Crestor    DVT prophylaxis: Lovenox Code Status:  Full code Family Communication: N/A  Disposition  Plan: Placed in observation, anticipate less than 48-hour stay Consults called: N/A Admission status: Insulin-dependent, observation stay, plan for discharge home.    HPI:     HPI: Carl Kim is a 54 y.o. male with medical history significant of hypertension, hyperlipidemia, coronary artery disease who presented to the ER with chest pain.  Patient reports having a 3-day history of intermittent mild left-sided chest pain, described as stabbing pressure-like associated with mild shortness of breath initially.  Symptoms are ongoing for 3 days however today he had a sudden episode of relatively sharp pain.  No fever, chills, cough, abdominal pain, nausea, vomiting, diarrhea, dysuria, dizziness, lightheadedness.  Pain is resolved at the time of my interview.  Does report having worked out more than usual Saturday which was a day before onset of initial pain.  He reports pain is similar to prior episode of chest pain when he had a cardiac stent placed.  Review of records shows he had PCI in January 2021.  EKG on presentation showed slight ST elevations.  Case was discussed with Dr. Gilmore Laroche from cardiology service at [facility] who did not believe patient needed any acute intervention and did not need to be transferred to the [facility]. ED Course:  Vital Signs reviewed on presentation, significant for temperature 97.6, heart rate 54, blood pressure 96/65, saturation 97% on room air. Labs reviewed, significant for sodium 137, potassium 3.6, BUN 18, creatinine 1.04, troponins negative x2, WBC count 6.5, hemoglobin 13.3, hematocrit of 40, platelets 210, SARS Covid RT-PCR is pending. Imaging personally Reviewed, chest x-ray shows no acute cardiopulmonary disease. EKG personally reviewed, shows sinus rhythm, mild ST deviations in  inferior and lateral leads.    Review of systems:    Review of Systems: As per HPI otherwise 10 point review of systems negative.  All other review of systems is negative  except the ones noted above in the HPI.    Past Medical and Surgical History:  Reviewed by me  Past Medical History:  Diagnosis Date  . Chest discomfort 2014   due to food poisoning/went to hospital per EMS  . Hypertension     Past Surgical History:  Procedure Laterality Date  . CERVICAL DISCECTOMY  2016   C4-5  . CORONARY STENT INTERVENTION N/A 06/22/2019   Procedure: CORONARY STENT INTERVENTION;  Surgeon: Nelva Bush, MD;  Location: [place] [facility];  Service: Cardiovascular;  Laterality: N/A;  . LEFT HEART CATH AND CORONARY ANGIOGRAPHY N/A 06/22/2019   Procedure: LEFT HEART CATH AND CORONARY ANGIOGRAPHY;  Surgeon: Nelva Bush, MD;  Location: [place] [facility];  Service: Cardiovascular;  Laterality: N/A;  . WRIST SURGERY     Left     Social History:  Reviewed by me   reports that he has never smoked. He has never used smokeless tobacco. He reports current alcohol use of about 10.0 - 12.0 standard drinks of alcohol per week. No history on file for drug.  Allergies:    No Known Allergies  Family History :   Family History  Problem Relation Age of Onset  . Diabetes Father   . Leukemia Father   . Diabetes Brother   . Colon cancer Neg Hx   . Stomach cancer Neg Hx   . Rectal cancer Neg Hx    Family history reviewed, noted as above, not pertinent to current presentation.   Home Medications:    Prior to Admission medications   Medication Sig Start Date End Date Taking? Authorizing Provider  aspirin EC 81 MG tablet Take 81 mg by mouth daily.   Yes [provider]  aspirin EC 81 MG tablet Take 324 mg by mouth once.   Yes [provider]  clopidogrel (PLAVIX) 75 MG tablet Take 1 tablet (75 mg total) by mouth daily. 06/22/19  Yes End, Harrell Gave, MD  famotidine (PEPCID) 20 MG tablet Take 40 mg by mouth 2 (two) times daily. To decrease to 20mg  twice daily 07/30/2019   Yes [provider]  fluticasone (FLONASE) 50 MCG/ACT nasal spray  Place 1 spray into both nostrils daily as needed for allergies or rhinitis.   Yes [provider]  lisinopril-hydrochlorothiazide (ZESTORETIC) 10-12.5 MG tablet Take 1 tablet by mouth daily. 06/18/19  Yes Duke, Tami Lin, PA  metoprolol succinate (TOPROL-XL) 25 MG 24 hr tablet Take 0.5 tablets (12.5 mg total) by mouth daily. 07/02/19  Yes Duke, Tami Lin, PA  Multiple Vitamin (MULTIVITAMIN WITH MINERALS) TABS tablet Take 2 tablets by mouth daily.   Yes [provider]  nitroGLYCERIN (NITROSTAT) 0.4 MG SL tablet Place 1 tablet (0.4 mg total) under the tongue every 5 (five) minutes as needed for chest pain. 06/18/19 09/16/19 Yes Duke, Tami Lin, PA  rosuvastatin (CRESTOR) 20 MG tablet Take 1 tablet (20 mg total) by mouth daily. 06/18/19 09/16/19 Yes Duke, Tami Lin, PA  clopidogrel (PLAVIX) 75 MG tablet Take 1 tablet (75 mg total) by mouth daily. Patient not taking: Reported on 07/29/2019 06/22/19 06/21/20  Nelva Bush, MD    Physical Exam:    Physical Exam: Vitals:   07/29/19 2100 07/29/19 2130 07/29/19 2204 07/29/19 2234  BP: 115/77 113/71 115/79 104/83  Pulse: Marland Kitchen)  56 61 (!) 58 (!) 59  Resp: 19 20 15 10   Temp:      TempSrc:      SpO2: 99% 99% 100% 97%  Weight:      Height:        Constitutional: NAD, calm, comfortable Vitals:   07/29/19 2100 07/29/19 2130 07/29/19 2204 07/29/19 2234  BP: 115/77 113/71 115/79 104/83  Pulse: (!) 56 61 (!) 58 (!) 59  Resp: 19 20 15 10   Temp:      TempSrc:      SpO2: 99% 99% 100% 97%  Weight:      Height:       Eyes: PERRL, lids and conjunctivae normal ENMT: Mucous membranes are moist. Posterior pharynx clear of any exudate or lesions.Normal dentition.  Neck: normal, supple, no masses, no thyromegaly Respiratory: clear to auscultation bilaterally, no wheezing, no crackles. Normal respiratory effort. No accessory muscle use.  Cardiovascular: Regular rate and rhythm, no murmurs / rubs / gallops. No extremity edema. 2+  pedal pulses. No carotid bruits.  Abdomen: no tenderness, no masses palpated. No hepatosplenomegaly. Bowel sounds positive.  Musculoskeletal: no clubbing / cyanosis. No joint deformity upper and lower extremities. Good ROM, no contractures. Normal muscle tone.  Skin: no rashes, lesions, ulcers. No induration Neurologic: CN 2-12 grossly intact. Sensation intact, DTR normal. Strength 5/5 in all 4.  Psychiatric: Normal judgment and insight. Alert and oriented x 3. Normal mood.    Decubitus Ulcers: Not present on admission Catheters and tubes: None  Data Review:    Labs on Admission: I have personally reviewed following labs and imaging studies  CBC: Recent Labs  Lab 07/29/19 1842  WBC 6.3  HGB 13.3  HCT 40.0  MCV 91.1  PLT 210   Basic Metabolic Panel: Recent Labs  Lab 07/29/19 1842  NA 137  K 3.6  CL 98  CO2 29  GLUCOSE 86  BUN 18  CREATININE 1.04  CALCIUM 9.3   GFR: Estimated Creatinine Clearance: 88.5 mL/min (by C-G formula based on SCr of 1.04 mg/dL). Liver Function Tests: No results for input(s): AST, ALT, ALKPHOS, BILITOT, PROT, ALBUMIN in the last 168 hours. No results for input(s): LIPASE, AMYLASE in the last 168 hours. No results for input(s): AMMONIA in the last 168 hours. Coagulation Profile: No results for input(s): INR, PROTIME in the last 168 hours. Cardiac Enzymes: No results for input(s): CKTOTAL, CKMB, CKMBINDEX, TROPONINI in the last 168 hours. BNP (last 3 results) No results for input(s): PROBNP in the last 8760 hours. HbA1C: No results for input(s): HGBA1C in the last 72 hours. CBG: No results for input(s): GLUCAP in the last 168 hours. Lipid Profile: No results for input(s): CHOL, HDL, LDLCALC, TRIG, CHOLHDL, LDLDIRECT in the last 72 hours. Thyroid Function Tests: No results for input(s): TSH, T4TOTAL, FREET4, T3FREE, THYROIDAB in the last 72 hours. Anemia Panel: No results for input(s): VITAMINB12, FOLATE, FERRITIN, TIBC, IRON, RETICCTPCT  in the last 72 hours. Urine analysis:    Component Value Date/Time   COLORURINE YELLOW 06/17/2011 2229   APPEARANCEUR CLEAR 06/17/2011 2229   LABSPEC 1.010 06/17/2011 2229   PHURINE 5.5 06/17/2011 2229   GLUCOSEU NEGATIVE 06/17/2011 2229   HGBUR NEGATIVE 06/17/2011 2229   BILIRUBINUR NEGATIVE 06/17/2011 2229   KETONESUR 15 (A) 06/17/2011 2229   PROTEINUR NEGATIVE 06/17/2011 2229   UROBILINOGEN 0.2 06/17/2011 2229   NITRITE NEGATIVE 06/17/2011 2229   LEUKOCYTESUR NEGATIVE 06/17/2011 2229     Imaging Results:      Radiological  Exams on Admission: DG Chest 2 View  Result Date: 07/29/2019 CLINICAL DATA:  Chest pain EXAM: CHEST - 2 VIEW COMPARISON:  Coronary calcium CT 06/16/2019 FINDINGS: The lungs are clear without focal pneumonia, edema, pneumothorax or pleural effusion. Symmetric tiny nodular densities in each lung base are consistent with nipple shadows. No pulmonary nodule seen on the CT scan from 6 weeks ago. The visualized bony structures of the thorax are intact. Telemetry leads overlie the chest. IMPRESSION: No active cardiopulmonary disease. Electronically Signed   By: Kennith Center M.D.   On: 07/29/2019 18:58      Loy Little Cyndie Chime MD [facility]  If 7PM-7AM, please contact night-coverage   07/29/2019, 11:07 PM

## 2019-07-29 NOTE — ED Notes (Signed)
Pt taken to Radiology at this time 

## 2019-07-29 NOTE — ED Provider Notes (Signed)
   Patient signed out to me by Trisha Mangle, PA-C at end of shift.  Awaiting consultation from hospitalist.  Patient is a 54 year old male with past medical history of hypertension hyperlipidemia and status post angioplasty with stent in January of this year.  He is here with with chest pain, mild pain for 3 days but worse today.  Pain relieved after taking sublingual nitroglycerin and 4 baby aspirin. He states pain is similar to previous chest pain he had when his cardiac stent was placed.  Initially, pain was associated with some shortness of breath and nausea.  Mild ST elevation on EKG, delta troponin is unchanged and CXR is reassuring. Given pt hx, it was felt that pt would need admission for cardiac rule out.     Consulted hospitalist, Dr, Darvin Neighbours, who agrees to admit, but requests cardiology be consulted.    Consulted cardiology, Dr.  Deforest Hoyles, who reviewed the EKG.  He felt that pt was appropriate for admission here and consult with cardiology here in the am.          Pauline Aus, PA-C 07/30/19 0054    Vanetta Mulders, MD 07/31/19 [phone]

## 2019-07-29 NOTE — ED Provider Notes (Signed)
[facility] EMERGENCY DEPARTMENT Provider Note   CSN: [id number] Arrival date & time: 07/29/19  1711     History Chief Complaint  Patient presents with  . Chest Pain    Carl Kim is a 54 y.o. male.  The history is provided by the patient. No language interpreter was used.  Chest Pain Pain location:  L chest Pain quality: aching   Pain radiates to:  Does not radiate Pain severity:  Moderate Onset quality:  Gradual Progression:  Improving Chronicity:  New Relieved by:  Nothing Worsened by:  Nothing Ineffective treatments:  None tried Associated symptoms: no shortness of breath   Risk factors: no hypertension   Pt reports he had a significant pain in his chest earlier today.  Pt took nitroglycerin with relief.  Pt had similar pain before he had a stent placed.  Pt reports he called cardiology after a similar episode yesterday and was told to come in.  Pt's primary told him to come in today      Past Medical History:  Diagnosis Date  . Chest discomfort 2014   due to food poisoning/went to hospital per EMS  . Hypertension     Patient Active Problem List   Diagnosis Date Noted  . Palpitations 07/02/2019  . Accelerating angina (HCC) 06/22/2019  . Abnormal stress test 06/22/2019  . S/P angioplasty with stent 06/22/2019  . Chest pain of uncertain etiology 05/06/2019  . Educated about COVID-19 virus infection 05/06/2019  . Hyperlipidemia 02/21/2011  . Hypertension   . Chest discomfort     Past Surgical History:  Procedure Laterality Date  . CERVICAL DISCECTOMY  2016   C4-5  . CORONARY STENT INTERVENTION N/A 06/22/2019   Procedure: CORONARY STENT INTERVENTION;  Surgeon: Yvonne Kendall, MD;  Location: [facility] IN[facility];  Service: Cardiovascular;  Laterality: N/A;  . LEFT HEART CATH AND CORONARY ANGIOGRAPHY N/A 06/22/2019   Procedure: LEFT HEART CATH AND CORONARY ANGIOGRAPHY;  Surgeon: Yvonne Kendall, MD;  Location: [facility] INV[facility];  Service: Cardiovascular;   Laterality: N/A;  . WRIST SURGERY     Left       Family History  Problem Relation Age of Onset  . Diabetes Father   . Leukemia Father   . Diabetes Brother   . Colon cancer Neg Hx   . Stomach cancer Neg Hx   . Rectal cancer Neg Hx     Social History   Tobacco Use  . Smoking status: Never Smoker  . Smokeless tobacco: Never Used  Substance Use Topics  . Alcohol use: Yes    Alcohol/week: 10.0 - 12.0 standard drinks    Types: 10 - 12 Cans of beer per week    Comment: occ  . Drug use: Not on file    Home Medications Prior to Admission medications   Medication Sig Start Date End Date Taking? Authorizing Provider  aspirin EC 81 MG tablet Take 81 mg by mouth daily.    [provider]  clopidogrel (PLAVIX) 75 MG tablet Take 1 tablet (75 mg total) by mouth daily. 06/22/19   End, Cristal Deer, MD  clopidogrel (PLAVIX) 75 MG tablet Take 1 tablet (75 mg total) by mouth daily. 06/22/19 06/21/20  End, Cristal Deer, MD  fluticasone (FLONASE) 50 MCG/ACT nasal spray Place 1 spray into both nostrils daily as needed for allergies or rhinitis.    [provider]  lisinopril-hydrochlorothiazide (ZESTORETIC) 10-12.5 MG tablet Take 1 tablet by mouth daily. 06/18/19   Marcelino Duster, PA  metoprolol succinate (TOPROL-XL) 25 MG 24 hr tablet Take 0.5 tablets (12.5 mg total) by mouth daily. 07/02/19   Duke, Tami Lin, PA  Multiple Vitamin (MULTIVITAMIN WITH MINERALS) TABS tablet Take 2 tablets by mouth daily.    [provider]  nitroGLYCERIN (NITROSTAT) 0.4 MG SL tablet Place 1 tablet (0.4 mg total) under the tongue every 5 (five) minutes as needed for chest pain. 06/18/19 09/16/19  Ledora Bottcher, PA  rosuvastatin (CRESTOR) 20 MG tablet Take 1 tablet (20 mg total) by mouth daily. 06/18/19 09/16/19  Ledora Bottcher, PA    Allergies    Patient has no known allergies.  Review of Systems   Review of Systems  Respiratory: Negative for shortness of breath.     Cardiovascular: Positive for chest pain.  All other systems reviewed and are negative.   Physical Exam Updated Vital Signs BP 115/77   Pulse (!) 56   Temp 97.6 F (36.4 C) (Oral)   Resp 19   Ht 5\' 11"  (1.803 m)   Wt 89.8 kg   SpO2 99%   BMI 27.62 kg/m   Physical Exam Vitals and nursing note reviewed.  Constitutional:      Appearance: He is well-developed.  HENT:     Head: Normocephalic and atraumatic.  Eyes:     Conjunctiva/sclera: Conjunctivae normal.  Cardiovascular:     Rate and Rhythm: Normal rate and regular rhythm.     Heart sounds: Normal heart sounds. No murmur.  Pulmonary:     Effort: Pulmonary effort is normal. No respiratory distress.     Breath sounds: Normal breath sounds.  Abdominal:     Palpations: Abdomen is soft.     Tenderness: There is no abdominal tenderness.  Musculoskeletal:     Cervical back: Neck supple.  Skin:    General: Skin is warm and dry.  Neurological:     General: No focal deficit present.     Mental Status: He is alert.     ED Results / Procedures / Treatments   Labs (all labs ordered are listed, but only abnormal results are displayed) Labs Reviewed  BASIC METABOLIC PANEL  CBC  TROPONIN I (HIGH SENSITIVITY)  TROPONIN I (HIGH SENSITIVITY)    EKG EKG Interpretation  Date/Time:  Wednesday July 29 2019 17:24:18 EST Ventricular Rate:  58 PR Interval:    QRS Duration: 88 QT Interval:  418 QTC Calculation: 411 R Axis:   36 Text Interpretation: Sinus rhythm ST elev, probable normal early repol pattern No significant change since last tracing Confirmed by Fredia Sorrow [phone]) on 07/29/2019 5:28:31 PM   Radiology DG Chest 2 View  Result Date: 07/29/2019 CLINICAL DATA:  Chest pain EXAM: CHEST - 2 VIEW COMPARISON:  Coronary calcium CT 06/16/2019 FINDINGS: The lungs are clear without focal pneumonia, edema, pneumothorax or pleural effusion. Symmetric tiny nodular densities in each lung base are consistent with nipple  shadows. No pulmonary nodule seen on the CT scan from 6 weeks ago. The visualized bony structures of the thorax are intact. Telemetry leads overlie the chest. IMPRESSION: No active cardiopulmonary disease. Electronically Signed   By: Misty Stanley M.D.   On: 07/29/2019 18:58    Procedures Procedures (including critical care time)  Medications Ordered in ED Medications - No data to display  ED Course  I have reviewed the triage vital signs and the nursing notes.  Pertinent labs & imaging results that were available during my care of the patient were reviewed by me and considered  in my medical decision making (see chart for details).    MDM Rules/Calculators/A&P                      MDM:  Pt's story is concerning.  Second troponin is pending.  Pt's care turned over to [facility].  Final Clinical Impression(s) / ED Diagnoses Final diagnoses:  Chest pain, unspecified type    Rx / DC Orders ED Discharge Orders    None       Osie Cheeks 07/29/19 2133    Vanetta Mulders, MD 07/31/19 [phone]

## 2019-07-29 NOTE — ED Notes (Signed)
ED Provider at bedside. 

## 2019-07-29 NOTE — ED Notes (Signed)
Pt ambulated to restroom w/o issue or complaint.

## 2019-07-29 NOTE — ED Triage Notes (Signed)
Pt brought in EMS for chest pain. Pt reports recently had a stent placed. Pt reports has four other blockages. Pt reports intermittent chest pain all throughout today. Pt reports pain comes on at rest. Pt reports with pain comes shortness of breath, nausea, sweating.   Per EMS, pt took 4 baby aspirin and 1 nitro prior to their arrival and chest pain was relieved en route but reports had episode the came and went while en route.   Pt arrived to ED and had another episode while sitting on stretcher. Pt reports pain is subsiding and is now 2/10.

## 2019-07-30 ENCOUNTER — Encounter (HOSPITAL_COMMUNITY): Payer: Self-pay | Admitting: Internal Medicine

## 2019-07-30 DIAGNOSIS — Z20822 Contact with and (suspected) exposure to covid-19: Secondary | ICD-10-CM | POA: Diagnosis not present

## 2019-07-30 DIAGNOSIS — I10 Essential (primary) hypertension: Secondary | ICD-10-CM | POA: Diagnosis not present

## 2019-07-30 DIAGNOSIS — R079 Chest pain, unspecified: Secondary | ICD-10-CM | POA: Diagnosis not present

## 2019-07-30 DIAGNOSIS — E785 Hyperlipidemia, unspecified: Secondary | ICD-10-CM | POA: Diagnosis not present

## 2019-07-30 DIAGNOSIS — R0789 Other chest pain: Secondary | ICD-10-CM | POA: Diagnosis not present

## 2019-07-30 LAB — CBC
HCT: 40.8 % (ref 39.0–52.0)
Hemoglobin: 13.6 g/dL (ref 13.0–17.0)
MCH: 30.6 pg (ref 26.0–34.0)
MCHC: 33.3 g/dL (ref 30.0–36.0)
MCV: 91.7 fL (ref 80.0–100.0)
Platelets: 194 10*3/uL (ref 150–400)
RBC: 4.45 MIL/uL (ref 4.22–5.81)
RDW: 12.8 % (ref 11.5–15.5)
WBC: 6.3 10*3/uL (ref 4.0–10.5)
nRBC: 0 % (ref 0.0–0.2)

## 2019-07-30 LAB — BASIC METABOLIC PANEL
Anion gap: 8 (ref 5–15)
BUN: 17 mg/dL (ref 6–20)
CO2: 30 mmol/L (ref 22–32)
Calcium: 9.3 mg/dL (ref 8.9–10.3)
Chloride: 100 mmol/L (ref 98–111)
Creatinine, Ser: 1.14 mg/dL (ref 0.61–1.24)
GFR calc Af Amer: >60 mL/min (ref >60)
GFR calc non Af Amer: >60 mL/min (ref >60)
Glucose, Bld: 109 mg/dL — ABNORMAL HIGH (ref 70–99)
Potassium: 3.4 mmol/L — ABNORMAL LOW (ref 3.5–5.1)
Sodium: 138 mmol/L (ref 135–145)

## 2019-07-30 LAB — TROPONIN I (HIGH SENSITIVITY)
Troponin I (High Sensitivity): <2 ng/L (ref <18)
Troponin I (High Sensitivity): <2 ng/L (ref <18)

## 2019-07-30 LAB — HIV ANTIBODY (ROUTINE TESTING W REFLEX): HIV Screen 4th Generation wRfx: NONREACTIVE

## 2019-07-30 LAB — SARS CORONAVIRUS 2 (TAT 6-24 HRS): SARS Coronavirus 2: NEGATIVE

## 2019-07-30 MED ORDER — ASPIRIN EC 81 MG PO TBEC: 81.0000 mg | DELAYED_RELEASE_TABLET | Freq: Every day | ORAL | 1 refills | Status: AC

## 2019-07-30 MED ORDER — ACETAMINOPHEN 650 MG RE SUPP: 650.0000 mg | Freq: Four times a day (QID) | RECTAL | Status: DC | PRN

## 2019-07-30 MED ORDER — SODIUM CHLORIDE 0.9 % IV SOLN: 250.0000 mL | INTRAVENOUS | Status: DC | PRN

## 2019-07-30 MED ORDER — LISINOPRIL-HYDROCHLOROTHIAZIDE 10-12.5 MG PO TABS: 0.5000 | ORAL_TABLET | Freq: Every day | ORAL | 1 refills | Status: DC

## 2019-07-30 MED ORDER — ACETAMINOPHEN 325 MG PO TABS: 650.0000 mg | ORAL_TABLET | Freq: Four times a day (QID) | ORAL | Status: DC | PRN

## 2019-07-30 MED ORDER — METOPROLOL SUCCINATE ER 25 MG PO TB24
12.5000 mg | ORAL_TABLET | Freq: Every day | ORAL | Status: DC
  Administered 2019-07-30: 12.5 mg via ORAL
  Filled 2019-07-30: qty 1

## 2019-07-30 MED ORDER — POLYETHYLENE GLYCOL 3350 17 G PO PACK: 17.0000 g | PACK | Freq: Every day | ORAL | Status: DC | PRN

## 2019-07-30 MED ORDER — NITROGLYCERIN 0.4 MG SL SUBL: 0.4000 mg | SUBLINGUAL_TABLET | SUBLINGUAL | Status: DC | PRN

## 2019-07-30 MED ORDER — ROSUVASTATIN CALCIUM 20 MG PO TABS
20.0000 mg | ORAL_TABLET | Freq: Every day | ORAL | Status: DC
  Administered 2019-07-30: 09:00:00 20 mg via ORAL
  Filled 2019-07-30: qty 1

## 2019-07-30 MED ORDER — LISINOPRIL 10 MG PO TABS
10.0000 mg | ORAL_TABLET | Freq: Every day | ORAL | Status: DC
  Filled 2019-07-30: qty 1

## 2019-07-30 MED ORDER — HYDROCHLOROTHIAZIDE 12.5 MG PO CAPS
12.5000 mg | ORAL_CAPSULE | Freq: Every day | ORAL | Status: DC
  Filled 2019-07-30: qty 1

## 2019-07-30 MED ORDER — PANTOPRAZOLE SODIUM 40 MG PO TBEC: 40.0000 mg | DELAYED_RELEASE_TABLET | Freq: Every day | ORAL | 1 refills | Status: DC

## 2019-07-30 MED ORDER — SODIUM CHLORIDE 0.9% FLUSH
3.0000 mL | Freq: Two times a day (BID) | INTRAVENOUS | Status: DC
  Administered 2019-07-30 (×2): 3 mL via INTRAVENOUS

## 2019-07-30 MED ORDER — ONDANSETRON HCL 4 MG/2ML IJ SOLN: 4.0000 mg | Freq: Four times a day (QID) | INTRAMUSCULAR | Status: DC | PRN

## 2019-07-30 MED ORDER — SODIUM CHLORIDE 0.9% FLUSH: 3.0000 mL | INTRAVENOUS | Status: DC | PRN

## 2019-07-30 MED ORDER — CLOPIDOGREL BISULFATE 75 MG PO TABS
75.0000 mg | ORAL_TABLET | Freq: Every day | ORAL | Status: DC
  Administered 2019-07-30: 75 mg via ORAL
  Filled 2019-07-30: qty 1

## 2019-07-30 MED ORDER — ONDANSETRON HCL 4 MG PO TABS: 4.0000 mg | ORAL_TABLET | Freq: Four times a day (QID) | ORAL | Status: DC | PRN

## 2019-07-30 MED ORDER — ACETAMINOPHEN 325 MG PO TABS: 650.0000 mg | ORAL_TABLET | Freq: Four times a day (QID) | ORAL | 0 refills | Status: DC | PRN

## 2019-07-30 MED ORDER — ADULT MULTIVITAMIN W/MINERALS CH
2.0000 | ORAL_TABLET | Freq: Every day | ORAL | Status: DC
  Administered 2019-07-30: 09:00:00 2 via ORAL
  Filled 2019-07-30 (×2): qty 2

## 2019-07-30 MED ORDER — ASPIRIN EC 81 MG PO TBEC
81.0000 mg | DELAYED_RELEASE_TABLET | Freq: Every day | ORAL | Status: DC
  Administered 2019-07-30: 09:00:00 81 mg via ORAL
  Filled 2019-07-30: qty 1

## 2019-07-30 MED ORDER — LISINOPRIL-HYDROCHLOROTHIAZIDE 10-12.5 MG PO TABS: 1.0000 | ORAL_TABLET | Freq: Every day | ORAL | Status: DC

## 2019-07-30 MED ORDER — FAMOTIDINE 20 MG PO TABS
20.0000 mg | ORAL_TABLET | Freq: Two times a day (BID) | ORAL | Status: DC
  Administered 2019-07-30: 20 mg via ORAL
  Filled 2019-07-30: qty 1

## 2019-07-30 MED ORDER — BISACODYL 10 MG RE SUPP: 10.0000 mg | Freq: Every day | RECTAL | Status: DC | PRN

## 2019-07-30 MED ORDER — FLUTICASONE PROPIONATE 50 MCG/ACT NA SUSP: 1.0000 | Freq: Every day | NASAL | Status: DC | PRN

## 2019-07-30 MED ORDER — ENOXAPARIN SODIUM 40 MG/0.4ML ~~LOC~~ SOLN
40.0000 mg | SUBCUTANEOUS | Status: DC
  Administered 2019-07-30: 05:00:00 40 mg via SUBCUTANEOUS
  Filled 2019-07-30: qty 0.4

## 2019-07-30 MED ORDER — TRAMADOL HCL 50 MG PO TABS: 50.0000 mg | ORAL_TABLET | Freq: Three times a day (TID) | ORAL | Status: DC | PRN

## 2019-07-30 NOTE — Discharge Instructions (Signed)
1)Avoid ibuprofen/Advil/Aleve/Motrin/Goody Powders/Naproxen/BC powders/Meloxicam/Diclofenac/Indomethacin and other Nonsteroidal anti-inflammatory medications as these will make you more likely to bleed and can cause stomach ulcers, can also cause Kidney problems.   2)Follow up with Dr. Antoine Poche (your Cardiologist) in a couple of weeks for recheck  3) please pay attention to the changes in your medications

## 2019-07-30 NOTE — Plan of Care (Signed)

## 2019-07-30 NOTE — Progress Notes (Signed)
Nsg Discharge Note  Admit Date:  07/29/2019 Discharge date: 07/30/2019   Carl Kim to be D/C'd home per MD order.  AVS completed.  Copy for chart, and copy for patient signed, and dated. Patient/caregiver able to verbalize understanding. Patient discharged home with clothes, glasses, cell phones, gold colored necklace with cross. Wife at bedside at discharge.   Discharge Medication: Allergies as of 07/30/2019   No Known Allergies     Medication List    STOP taking these medications   Pepcid 20 MG tablet Generic drug: famotidine     TAKE these medications   acetaminophen 325 MG tablet Commonly known as: TYLENOL Take 2 tablets (650 mg total) by mouth every 6 (six) hours as needed for mild pain (or Fever >/= 101).   aspirin EC 81 MG tablet Take 1 tablet (81 mg total) by mouth daily with breakfast. What changed:   when to take this  Another medication with the same name was removed. Continue taking this medication, and follow the directions you see here.   clopidogrel 75 MG tablet Commonly known as: Plavix Take 1 tablet (75 mg total) by mouth daily. What changed: Another medication with the same name was removed. Continue taking this medication, and follow the directions you see here.   fluticasone 50 MCG/ACT nasal spray Commonly known as: FLONASE Place 1 spray into both nostrils daily as needed for allergies or rhinitis.   lisinopril-hydrochlorothiazide 10-12.5 MG tablet Commonly known as: ZESTORETIC Take 0.5 tablets by mouth daily. What changed: how much to take   metoprolol succinate 25 MG 24 hr tablet Commonly known as: TOPROL-XL Take 0.5 tablets (12.5 mg total) by mouth daily.   multivitamin with minerals Tabs tablet Take 2 tablets by mouth daily.   nitroGLYCERIN 0.4 MG SL tablet Commonly known as: NITROSTAT Place 1 tablet (0.4 mg total) under the tongue every 5 (five) minutes as needed for chest pain.   pantoprazole 40 MG tablet Commonly known as:  Protonix Take 1 tablet (40 mg total) by mouth daily.   rosuvastatin 20 MG tablet Commonly known as: CRESTOR Take 1 tablet (20 mg total) by mouth daily.       Discharge Assessment: Vitals:   07/30/19 0912 07/30/19 1301  BP: 103/75 112/81  Pulse: 63 60  Resp:  20  Temp:  97.8 F (36.6 C)  SpO2:  100%   Skin clean, dry and intact without evidence of skin break down, no evidence of skin tears noted. IV catheter discontinued intact. Site without signs and symptoms of complications - no redness or edema noted at insertion site, patient denies c/o pain - only slight tenderness at site.  Dressing with slight pressure applied.  D/c Instructions-Education: Discharge instructions given to patient/family with verbalized understanding. D/c education completed with patient/family including follow up instructions, medication list, d/c activities limitations if indicated, with other d/c instructions as indicated by MD - patient able to verbalize understanding, all questions fully answered. Patient instructed to return to ED, call 911, or call MD for any changes in condition.  Patient escorted via WC, and D/C home via private auto.  Sharmon Leyden, RN 07/30/2019 1:07 PM

## 2019-07-30 NOTE — Discharge Summary (Signed)
Carl Kim, is a 54 y.o. male  DOB 10-May-1966  MRN [id number].  Admission date:  07/29/2019  Admitting Physician  Olga Coaster, MD  Discharge Date:  07/30/2019   Primary MD  Roderick Pee, PA  Recommendations for primary care physician for things to follow:   1)Avoid ibuprofen/Advil/Aleve/Motrin/Goody Powders/Naproxen/BC powders/Meloxicam/Diclofenac/Indomethacin and other Nonsteroidal anti-inflammatory medications as these will make you more likely to bleed and can cause stomach ulcers, can also cause Kidney problems.   2)Follow up with Dr. Caprice Red (your Cardiologist) in a couple of weeks for recheck  3) please pay attention to the changes in your medications  Admission Diagnosis  Chest pain [R07.9] Chest pain, unspecified type [R07.9]   Discharge Diagnosis  Chest pain [R07.9] Chest pain, unspecified type [R07.9]    Principal Problem:   Chest pain of uncertain etiology Active Problems:   Hypertension   Hyperlipidemia   S/P angioplasty with stent   Chest pain      Past Medical History:  Diagnosis Date  . Chest discomfort 2014   due to food poisoning/went to hospital per EMS  . Hypertension     Past Surgical History:  Procedure Laterality Date  . CERVICAL DISCECTOMY  2016   C4-5  . CORONARY STENT INTERVENTION N/A 06/22/2019   Procedure: CORONARY STENT INTERVENTION;  Surgeon: Yvonne Kendall, MD;  Location: [facility];  Service: Cardiovascular;  Laterality: N/A;  . LEFT HEART CATH AND CORONARY ANGIOGRAPHY N/A 06/22/2019   Procedure: LEFT HEART CATH AND CORONARY ANGIOGRAPHY;  Surgeon: Yvonne Kendall, MD;  Location: [facility];  Service: Cardiovascular;  Laterality: N/A;  . WRIST SURGERY     Left       HPI  from the history and physical done on the day of admission:    -HPI: Carl Kim is a 54 y.o. male with medical history significant of hypertension,  hyperlipidemia, coronary artery disease who presented to the ER with chest pain.  Patient reports having a 3-day history of intermittent mild left-sided chest pain, described as stabbing pressure-like associated with mild shortness of breath initially.  Symptoms are ongoing for 3 days however today he had a sudden episode of relatively sharp pain.  No fever, chills, cough, abdominal pain, nausea, vomiting, diarrhea, dysuria, dizziness, lightheadedness.  Pain is resolved at the time of my interview.  Does report having worked out more than usual Saturday which was a day before onset of initial pain.  He reports pain is similar to prior episode of chest pain when he had a cardiac stent placed.  Review of records shows he had PCI in January 2021.  EKG on presentation showed slight ST elevations.  Case was discussed with Dr. Gilmore Laroche from cardiology service at [facility] who did not believe patient needed any acute intervention and did not need to be transferred to the [facility]. ED Course:  Vital Signs reviewed on presentation, significant for temperature 97.6, heart rate 54, blood pressure 96/65, saturation 97% on room air. Labs reviewed, significant for  sodium 137, potassium 3.6, BUN 18, creatinine 1.04, troponins negative x2, WBC count 6.5, hemoglobin 13.3, hematocrit of 40, platelets 210, SARS Covid RT-PCR is pending. Imaging personally Reviewed, chest x-ray shows no acute cardiopulmonary disease. EKG personally reviewed, shows sinus rhythm, mild ST deviations in inferior and lateral leads.    Hospital Course:      1) atypical chest pain--- ruled out for ACS by cardiac enzymes and EKG (ST segment changes consistent with early repolarization)--admitting provider discussed case with on-call cardiologist Dr. Gilmore Laroche, who reviewed the case and deemed this not to be acute MI -Chart review performed with Dr. Purvis Sheffield, who reviewed EKG and available data and records -As per Dr. Purvis Sheffield chest pain  does not appear to be cardiac in etiology -Patient has consistently reproducible chest pain with positional change and with palpation -Serial troponins and serial EKGs noted without acute findings or ACS pattern -No dyspnea on exertion -Interpretation of EKG, troponin and other clinical and lab findings as relayed to me by Dr. Purvis Sheffield the on-call cardiologist relayed to patient and his wife-- -patient apparently had been lifting weights and trying to work out recently suspect costochondritis/musculoskeletal chest wall pain  2) CAD--- status post stent in January 2021--- He underwent angiography on 06/22/19 which revealed sequential lesion I the mid LAD of 90% followed by 40% treated with DES, which jailed a D2 branch which had 60% ostial stenosis. He has residual disease in the distal LAD and large OM1 branch. He was discharged on ASA and plavix for at least 6 months.  -Continue aspirin, Plavix, metoprolol and Crestor --Current chest discomfort is different from his ACS discomfort previously  3)GERD--stop pepcid,  give Protonix  Discharge Condition: Stable  Follow UP--- cardiologist Dr. Antoine Poche     Consults obtained -discussed with local cardiologist Dr. Purvis Sheffield who reviewed her chart and EKGs  Diet and Activity recommendation:  As advised  Discharge Instructions    Discharge Instructions    Call MD for:  difficulty breathing, headache or visual disturbances   Complete by: As directed    Call MD for:  persistant dizziness or light-headedness   Complete by: As directed    Call MD for:  persistant nausea and vomiting   Complete by: As directed    Call MD for:  severe uncontrolled pain   Complete by: As directed    Call MD for:  temperature >100.4   Complete by: As directed    Diet - low sodium heart healthy   Complete by: As directed    Discharge instructions   Complete by: As directed    1)Avoid ibuprofen/Advil/Aleve/Motrin/Goody Powders/Naproxen/BC  powders/Meloxicam/Diclofenac/Indomethacin and other Nonsteroidal anti-inflammatory medications as these will make you more likely to bleed and can cause stomach ulcers, can also cause Kidney problems.   2)Follow up with Dr. Caprice Red (your Cardiologist) in a couple of weeks for recheck  3) please pay attention to the changes in your medications   Increase activity slowly   Complete by: As directed         Discharge Medications     Allergies as of 07/30/2019   No Known Allergies     Medication List    STOP taking these medications   Pepcid 20 MG tablet Generic drug: famotidine     TAKE these medications   acetaminophen 325 MG tablet Commonly known as: TYLENOL Take 2 tablets (650 mg total) by mouth every 6 (six) hours as needed for mild pain (or Fever >/= 101).   aspirin EC 81 MG  tablet Take 1 tablet (81 mg total) by mouth daily with breakfast. What changed:   when to take this  Another medication with the same name was removed. Continue taking this medication, and follow the directions you see here.   clopidogrel 75 MG tablet Commonly known as: Plavix Take 1 tablet (75 mg total) by mouth daily. What changed: Another medication with the same name was removed. Continue taking this medication, and follow the directions you see here.   fluticasone 50 MCG/ACT nasal spray Commonly known as: FLONASE Place 1 spray into both nostrils daily as needed for allergies or rhinitis.   lisinopril-hydrochlorothiazide 10-12.5 MG tablet Commonly known as: ZESTORETIC Take 0.5 tablets by mouth daily. What changed: how much to take   metoprolol succinate 25 MG 24 hr tablet Commonly known as: TOPROL-XL Take 0.5 tablets (12.5 mg total) by mouth daily.   multivitamin with minerals Tabs tablet Take 2 tablets by mouth daily.   nitroGLYCERIN 0.4 MG SL tablet Commonly known as: NITROSTAT Place 1 tablet (0.4 mg total) under the tongue every 5 (five) minutes as needed for chest pain.     pantoprazole 40 MG tablet Commonly known as: Protonix Take 1 tablet (40 mg total) by mouth daily.   rosuvastatin 20 MG tablet Commonly known as: CRESTOR Take 1 tablet (20 mg total) by mouth daily.       Major procedures and Radiology Reports - PLEASE review detailed and final reports for all details, in brief -   DG Chest 2 View  Result Date: 07/29/2019 CLINICAL DATA:  Chest pain EXAM: CHEST - 2 VIEW COMPARISON:  Coronary calcium CT 06/16/2019 FINDINGS: The lungs are clear without focal pneumonia, edema, pneumothorax or pleural effusion. Symmetric tiny nodular densities in each lung base are consistent with nipple shadows. No pulmonary nodule seen on the CT scan from 6 weeks ago. The visualized bony structures of the thorax are intact. Telemetry leads overlie the chest. IMPRESSION: No active cardiopulmonary disease. Electronically Signed   By: Kennith Center M.D.   On: 07/29/2019 18:58   LONG TERM MONITOR (3-14 DAYS)  Result Date: 07/24/2019 NSR No arrhythmias.    Micro Results   No results found for this or any previous visit (from the past 240 hour(s)).  Today   Subjective    Carl Kim today has no new complaints, -Wife at bedside, -Patient ambulated with me from room 24 to the nursing station and back couple times--- he remained chest pain-free, without dyspnea on exertion, no dizziness no palpitations        --On his telemetry box he remained in sinus rhythm  Patient has been seen and examined prior to discharge   Objective   Blood pressure 103/75, pulse 63, temperature 98.2 F (36.8 C), temperature source Oral, resp. rate 18, height 5\' 11"  (1.803 m), weight 91.9 kg, SpO2 99 %.   Intake/Output Summary (Last 24 hours) at 07/30/2019 1143 Last data filed at 07/30/2019 0648 Gross per 24 hour  Intake 240 ml  Output 600 ml  Net -360 ml   Exam Gen:- Awake Alert, no acute distress  HEENT:- [place].AT, No sclera icterus Neck-Supple Neck,No JVD,.  Lungs-  CTAB , good air  movement bilaterally  CV- S1, S2 normal, regular, consistently reproducible left anterior chest wall tenderness with palpation and positional change Abd-  +ve B.Sounds, Abd Soft, No tenderness,    Extremity/Skin:- No  edema,   good pulses Psych-affect is appropriate, oriented x3 Neuro-no new focal deficits, no tremors  Data Review   CBC w Diff:  Lab Results  Component Value Date   WBC 6.3 07/30/2019   HGB 13.6 07/30/2019   HGB 15.7 06/18/2019   HCT 40.8 07/30/2019   HCT 45.6 06/18/2019   PLT 194 07/30/2019   PLT 257 06/18/2019    CMP:  Lab Results  Component Value Date   NA 138 07/30/2019   NA 139 07/02/2019   K 3.4 (L) 07/30/2019   CL 100 07/30/2019   CO2 30 07/30/2019   BUN 17 07/30/2019   BUN 25 (H) 07/02/2019   CREATININE 1.14 07/30/2019   PROT 6.8 06/17/2011   ALBUMIN 4.2 06/17/2011   BILITOT 2.0 (H) 06/17/2011   ALKPHOS 65 06/17/2011   AST 20 06/17/2011   ALT 24 06/17/2011  .   Total Discharge time is about 33 minutes  Roxan Hockey M.D on 07/30/2019 at 11:43 AM  Go to [email or web address] -  for contact info  [facility] - Office  [phone]

## 2019-07-30 NOTE — Progress Notes (Signed)
**Note De-Identified Pheng Prokop Obfuscation** EKG complete and placed in patient

## 2019-08-05 ENCOUNTER — Telehealth: Payer: Self-pay | Admitting: Cardiology

## 2019-08-05 NOTE — Telephone Encounter (Signed)
New Message   Patient is being told by [facility] [employer] that he needs a letter stating that he is safe and ok to get the vaccine with his use of cardiac medications. I informed patient that he will not need a letter and he states that he was told he has to have one to get the vaccine. Please assist.       We are recommending the COVID-19 vaccine to all of our patients. Cardiac medications (including blood thinners) should not deter anyone from being vaccinated and there is no need to hold any of those medications prior to vaccine administration.     Currently, there is a hotline to call (active 06/12/19) to schedule vaccination appointments as no walk-ins will be accepted.   Number: [phone].    If an appointment is not available please go to [email or web address] to sign up for notification when additional vaccine appointments are available.   If you have further questions or concerns about the vaccine process, please visit [email or web address] or contact your primary care physician.

## 2019-08-05 NOTE — Telephone Encounter (Signed)
Unable to reach patient by phone. Letter for Covid vaccine and Plavix to be printed and left at the front desk for patient. Left message for patient to call back if there were any other medications that needed to be listed.

## 2019-08-10 DIAGNOSIS — Z23 Encounter for immunization: Secondary | ICD-10-CM | POA: Diagnosis not present

## 2019-08-13 ENCOUNTER — Telehealth: Payer: Self-pay | Admitting: *Deleted

## 2019-08-13 NOTE — Telephone Encounter (Signed)
Left message to return a call to discuss sleep study appointment details. 

## 2019-08-16 DIAGNOSIS — I2510 Atherosclerotic heart disease of native coronary artery without angina pectoris: Secondary | ICD-10-CM | POA: Insufficient documentation

## 2019-08-16 DIAGNOSIS — I25119 Atherosclerotic heart disease of native coronary artery with unspecified angina pectoris: Secondary | ICD-10-CM | POA: Insufficient documentation

## 2019-08-16 NOTE — Progress Notes (Signed)
Cardiology Office Note   Date:  08/17/2019   ID:  Carl Kim, DOB [birth year], MRN [id number]  PCP:  Roderick Pee, PA  Cardiologist:   Rollene Rotunda, MD   Chief Complaint  Patient presents with  . Coronary Artery Disease      History of Present Illness: Carl Kim is a 54 y.o. male who presents for follow up of CAD. He reported sporadic chest pain that was non-exertional. He underwent calcium score by CT which revealed calcification in the LAD and a calcium score of 42 which placed him in the 78th percentile for age and sex matched controls. He underwent POET which was abnormal revealing ST depressions in inferior and lateral leads. I saw him for pre-cath evaluation. He underwent angiography on 06/22/19 which revealed sequential lesion I the mid LAD of 90% followed by 40% treated with DES, which jailed a D2 branch which had 60% ostial stenosis. He has residual disease in the distal LAD and large OM1 branch. He was discharged on ASA and plavix for at least 6 months.   Since I last saw him he was in admitted to [facility] with chest pain.  This was felt to be atypical and likely musculoskeletal. He is exercising routinely. He is lost about 25 pounds. He is eating well. The patient denies any new symptoms such as chest discomfort, neck or arm discomfort. There has been no new shortness of breath, PND or orthopnea. There have been no reported palpitations, presyncope or syncope.    Past Medical History:  Diagnosis Date  . Chest discomfort 2014   due to food poisoning/went to hospital per EMS  . Hypertension     Past Surgical History:  Procedure Laterality Date  . CERVICAL DISCECTOMY  2016   C4-5  . CORONARY STENT INTERVENTION N/A 06/22/2019   Procedure: CORONARY STENT INTERVENTION;  Surgeon: Yvonne Kendall, MD;  Location: [facility];  Service: Cardiovascular;  Laterality: N/A;  . LEFT HEART CATH AND CORONARY ANGIOGRAPHY N/A 06/22/2019   Procedure: LEFT HEART CATH AND  CORONARY ANGIOGRAPHY;  Surgeon: Yvonne Kendall, MD;  Location: [facility];  Service: Cardiovascular;  Laterality: N/A;  . WRIST SURGERY     Left     Current Outpatient Medications  Medication Sig Dispense Refill  . acetaminophen (TYLENOL) 325 MG tablet Take 2 tablets (650 mg total) by mouth every 6 (six) hours as needed for mild pain (or Fever >/= 101). 12 tablet 0  . aspirin EC 81 MG tablet Take 1 tablet (81 mg total) by mouth daily with breakfast. 30 tablet 1  . clopidogrel (PLAVIX) 75 MG tablet Take 1 tablet (75 mg total) by mouth daily. 30 tablet 0  . fluticasone (FLONASE) 50 MCG/ACT nasal spray Place 1 spray into both nostrils daily as needed for allergies or rhinitis.    Marland Kitchen lisinopril-hydrochlorothiazide (ZESTORETIC) 10-12.5 MG tablet Take 0.5 tablets by mouth daily. 90 tablet 1  . metoprolol succinate (TOPROL-XL) 25 MG 24 hr tablet Take 0.5 tablets (12.5 mg total) by mouth daily. 45 tablet 1  . Multiple Vitamin (MULTIVITAMIN WITH MINERALS) TABS tablet Take 2 tablets by mouth daily.    . nitroGLYCERIN (NITROSTAT) 0.4 MG SL tablet Place 1 tablet (0.4 mg total) under the tongue every 5 (five) minutes as needed for chest pain. 25 tablet 4  . pantoprazole (PROTONIX) 40 MG tablet Take 1 tablet (40 mg total) by mouth daily. 30 tablet 1  . rosuvastatin (CRESTOR) 20 MG tablet Take  1 tablet (20 mg total) by mouth daily. 30 tablet 3   No current facility-administered medications for this visit.    Allergies:   Patient has no known allergies.    ROS:  Please see the history of present illness.   Otherwise, review of systems are positive for none.   All other systems are reviewed and negative.    PHYSICAL EXAM: VS:  BP 130/80   Pulse (!) 54   Ht 5\' 10"  (1.778 m)   Wt 201 lb 3.2 oz (91.3 kg)   BMI 28.87 kg/m  , BMI Body mass index is 28.87 kg/m. GENERAL:  Well appearing NECK:  No jugular venous distention, waveform within normal limits, carotid upstroke brisk and symmetric, no  bruits, no thyromegaly LUNGS:  Clear to auscultation bilaterally CHEST:  Unremarkable HEART:  PMI not displaced or sustained,S1 and S2 within normal limits, no S3, no S4, no clicks, no rubs, no murmurs ABD:  Flat, positive bowel sounds normal in frequency in pitch, no bruits, no rebound, no guarding, no midline pulsatile mass, no hepatomegaly, no splenomegaly EXT:  2 plus pulses throughout, no edema, no cyanosis no clubbing  EKG:  EKG is ordered today. The ekg ordered today demonstrates sinus rhythm, rate 54, axis within normal limits, intervals within normal limits, no acute ST-T wave changes.   Recent Labs: 07/02/2019: Magnesium 2.1; TSH 1.230 07/30/2019: BUN 17; Creatinine, Ser 1.14; Hemoglobin 13.6; Platelets 194; Potassium 3.4; Sodium 138    Lipid Panel    Component Value Date/Time   CHOL 192 05/12/2019 0821   TRIG 112 05/12/2019 0821   HDL 49 05/12/2019 0821   CHOLHDL 3.9 05/12/2019 0821   LDLCALC 123 (H) 05/12/2019 0821      Wt Readings from Last 3 Encounters:  08/17/19 201 lb 3.2 oz (91.3 kg)  07/30/19 202 lb 9.6 oz (91.9 kg)  07/02/19 210 lb (95.3 kg)      Other studies Reviewed: Additional studies/ records that were reviewed today include: I reviewed the images with the patient in the room from his catheterization. We went over the diagrams and the actual pictures. I also reviewed his records from [facility]. Review of the above records demonstrates:  Please see elsewhere in the note.     ASSESSMENT AND PLAN:  CAD: No change in therapy. He will continue risk reduction. He can stop his Plavix at the end of July which will be in 6 months.  DYSLIPIDEMIA: He had an LDL done today and I will review this. The goal will be LDL less than 70.  HTN: The blood pressure is upper limits of normal and he says this is unusual. He actually had his lisinopril reduced recently when he was in the hospital.  COVID EDUCATION: He had his vaccine.  Current medicines are reviewed at  length with the patient today.  The patient does not have concerns regarding medicines.  The following changes have been made:  no change  Labs/ tests ordered today include: None  Orders Placed This Encounter  Procedures  . EKG 12-Lead     Disposition:   FU with me in 12 months.     Signed, Minus Breeding, MD  08/17/2019 4:30 PM    [place]

## 2019-08-17 ENCOUNTER — Ambulatory Visit: Payer: BC Managed Care – PPO | Admitting: Cardiology

## 2019-08-17 ENCOUNTER — Other Ambulatory Visit (HOSPITAL_COMMUNITY)
Admission: RE | Admit: 2019-08-17 | Discharge: 2019-08-17 | Disposition: A | Discharge status: Home / Self Care | Payer: BC Managed Care – PPO | Source: Ambulatory Visit | Attending: Cardiovascular Disease | Admitting: Cardiovascular Disease

## 2019-08-17 ENCOUNTER — Encounter: Payer: Self-pay | Admitting: Cardiology

## 2019-08-17 ENCOUNTER — Other Ambulatory Visit: Payer: Self-pay

## 2019-08-17 VITALS — BP 130/80 | HR 54 | Ht 70.0 in | Wt 201.2 lb

## 2019-08-17 DIAGNOSIS — I10 Essential (primary) hypertension: Secondary | ICD-10-CM | POA: Diagnosis not present

## 2019-08-17 DIAGNOSIS — I2510 Atherosclerotic heart disease of native coronary artery without angina pectoris: Secondary | ICD-10-CM | POA: Diagnosis not present

## 2019-08-17 DIAGNOSIS — Z01812 Encounter for preprocedural laboratory examination: Secondary | ICD-10-CM | POA: Diagnosis not present

## 2019-08-17 DIAGNOSIS — Z20822 Contact with and (suspected) exposure to covid-19: Secondary | ICD-10-CM | POA: Diagnosis not present

## 2019-08-17 DIAGNOSIS — E785 Hyperlipidemia, unspecified: Secondary | ICD-10-CM | POA: Diagnosis not present

## 2019-08-17 DIAGNOSIS — R002 Palpitations: Secondary | ICD-10-CM

## 2019-08-17 DIAGNOSIS — Z7189 Other specified counseling: Secondary | ICD-10-CM

## 2019-08-17 LAB — SARS CORONAVIRUS 2 (TAT 6-24 HRS): SARS Coronavirus 2: NEGATIVE

## 2019-08-17 LAB — LIPID PANEL
Chol/HDL Ratio: 2.3 ratio (ref 0.0–5.0)
Cholesterol, Total: 91 mg/dL — ABNORMAL LOW (ref 100–199)
HDL: 40 mg/dL (ref >39)
LDL Chol Calc (NIH): 35 mg/dL (ref 0–99)
Triglycerides: 76 mg/dL (ref 0–149)
VLDL Cholesterol Cal: 16 mg/dL (ref 5–40)

## 2019-08-17 NOTE — Patient Instructions (Signed)
Medication Instructions:   STOP Plavix at the end of July 2021 *If you need a refill on your cardiac medications before your next appointment, please call your pharmacy*  Lab Work: NONE ordered at this time of appointment   If you have labs (blood work) drawn today and your tests are completely normal, you will receive your results only by: Marland Kitchen MyChart Message (if you have MyChart) OR . A paper copy in the mail If you have any lab test that is abnormal or we need to change your treatment, we will call you to review the results.  Testing/Procedures: NONE ordered at this time of appointment   Follow-Up: At [facility], you and your health needs are our priority.  As part of our continuing mission to provide you with exceptional heart care, we have created designated Provider Care Teams.  These Care Teams include your primary Cardiologist (physician) and Advanced Practice Providers (APPs -  Physician Assistants and Nurse Practitioners) who all work together to provide you with the care you need, when you need it.   Your next appointment:   1 year(s)  The format for your next appointment:   In Person  Provider:   You may see Rollene Rotunda, MD or one of the following Advanced Practice Providers on your designated Care Team:    Theodore Demark, PA-C  Joni Reining, DNP, ANP  Cadence Fransico Michael, NP  Other Instructions

## 2019-08-20 ENCOUNTER — Ambulatory Visit (HOSPITAL_BASED_OUTPATIENT_CLINIC_OR_DEPARTMENT_OTHER): Payer: BC Managed Care – PPO | Attending: Physician Assistant | Admitting: Cardiovascular Disease

## 2019-08-20 ENCOUNTER — Other Ambulatory Visit: Payer: Self-pay

## 2019-08-20 DIAGNOSIS — R0902 Hypoxemia: Secondary | ICD-10-CM | POA: Diagnosis not present

## 2019-08-20 DIAGNOSIS — G4736 Sleep related hypoventilation in conditions classified elsewhere: Secondary | ICD-10-CM | POA: Insufficient documentation

## 2019-08-20 DIAGNOSIS — Z95820 Peripheral vascular angioplasty status with implants and grafts: Secondary | ICD-10-CM

## 2019-08-20 DIAGNOSIS — R400 Somnolence: Secondary | ICD-10-CM

## 2019-08-20 DIAGNOSIS — Z7901 Long term (current) use of anticoagulants: Secondary | ICD-10-CM | POA: Insufficient documentation

## 2019-08-20 DIAGNOSIS — R0683 Snoring: Secondary | ICD-10-CM

## 2019-08-20 DIAGNOSIS — Z7982 Long term (current) use of aspirin: Secondary | ICD-10-CM | POA: Diagnosis not present

## 2019-08-20 DIAGNOSIS — R0689 Other abnormalities of breathing: Secondary | ICD-10-CM

## 2019-08-20 DIAGNOSIS — G4730 Sleep apnea, unspecified: Secondary | ICD-10-CM

## 2019-08-20 DIAGNOSIS — G4733 Obstructive sleep apnea (adult) (pediatric): Secondary | ICD-10-CM | POA: Insufficient documentation

## 2019-08-20 DIAGNOSIS — Z79899 Other long term (current) drug therapy: Secondary | ICD-10-CM | POA: Insufficient documentation

## 2019-08-20 DIAGNOSIS — I10 Essential (primary) hypertension: Secondary | ICD-10-CM

## 2019-08-30 ENCOUNTER — Encounter (HOSPITAL_BASED_OUTPATIENT_CLINIC_OR_DEPARTMENT_OTHER): Payer: Self-pay | Admitting: Cardiovascular Disease

## 2019-08-30 NOTE — Procedures (Signed)
Patient Name: Celedonio, Sortino Date: 08/20/2019 Gender: Male D.O.B: 01-Sep-1965 Age (years): 54 Referring Provider: Tami Lin Duke Height (inches): 70 Interpreting Physician: Shelva Majestic MD, ABSM Weight (lbs): 195 RPSGT: Baxter Flattery BMI: 28 MRN: [id number] Neck Size: 16.00  CLINICAL INFORMATION Sleep Study Type: NPSG  Indication for sleep study: Snoring, Witnesses Apnea / Gasping During Sleep  Epworth Sleepiness Score: 9  SLEEP STUDY TECHNIQUE As per the AASM Manual for the Scoring of Sleep and Associated Events v2.3 (April 2016) with a hypopnea requiring 4% desaturations.  The channels recorded and monitored were frontal, central and occipital EEG, electrooculogram (EOG), submentalis EMG (chin), nasal and oral airflow, thoracic and abdominal wall motion, anterior tibialis EMG, snore microphone, electrocardiogram, and pulse oximetry.  MEDICATIONS acetaminophen (TYLENOL) 325 MG tablet  aspirin EC 81 MG tablet  clopidogrel (PLAVIX) 75 MG tablet  fluticasone (FLONASE) 50 MCG/ACT nasal spray  lisinopril-hydrochlorothiazide (ZESTORETIC) 10-12.5 MG tablet  metoprolol succinate (TOPROL-XL) 25 MG 24 hr tablet  Multiple Vitamin (MULTIVITAMIN WITH MINERALS) TABS tablet  nitroGLYCERIN (NITROSTAT) 0.4 MG SL tablet  pantoprazole (PROTONIX) 40 MG tablet  rosuvastatin (CRESTOR) 20 MG tablet  Medications self-administered by patient taken the night of the study : N/A  SLEEP ARCHITECTURE The study was initiated at 10:01:03 PM and ended at 5:18:53 AM.  Sleep onset time was 8.6 minutes and the sleep efficiency was 81.4%%. The total sleep time was 356.2 minutes.  Stage REM latency was 67.5 minutes.  The patient spent 2.2%% of the night in stage N1 sleep, 74.9%% in stage N2 sleep, 0.0%% in stage N3 and 22.9% in REM.  Alpha intrusion was absent.  Supine sleep was 13.02%.  RESPIRATORY PARAMETERS The overall apnea/hypopnea index (AHI) was 4.2 per hour. The respiratory  disturbance index (RDI) was 4.5/h. There were 2 total apneas, including 1 obstructive, 1 central and 0 mixed apneas. There were 23 hypopneas and 2 RERAs.  The AHI during Stage REM sleep was 9.6 per hour.  AHI while supine was 5.2 per hour.  The mean oxygen saturation was 94.9%. The minimum SpO2 during sleep was 87.0%.  Soft snoring was noted during this study.  CARDIAC DATA The 2 lead EKG demonstrated sinus rhythm. The mean heart rate was 55.0 beats per minute. Other EKG findings include: None.  LEG MOVEMENT DATA The total PLMS were 0 with a resulting PLMS index of 0.0. Associated arousal with leg movement index was 0.0 .  IMPRESSIONS - Increased Upper Airway Resistance Syndrome (UARS) without significant obstructive sleep apnea overall (AHI 4.2/h); however, mild sleep apnea was present with supine position (AHI 5.2/h) and during REM sleep (AHI 9.6/h).  - No significant central sleep apnea occurred during this study (CAI = 0.2/h). - Mild oxygen desaturation to a nadir of 87.0%. - The patient snored with soft snoring volume. - No cardiac abnormalities were noted during this study. - Clinically significant periodic limb movements did not occur during sleep. No significant associated arousals.  DIAGNOSIS - Sleep Apnea, unspecified type G47.30 - Nocturnal Hypoxemia (327.26 [G47.36 ICD-10])  RECOMMENDATIONS - Effort should be made to optimize nasal and oropharyngeal patency. - Consider initial alternatives for snoring and CPAP therapy with consideration of a customized oral appliance.  - If symptoms progress consider a future PSG evaluation.  - Avoid alcohol, sedatives and other CNS depressants that may worsen sleep apnea and disrupt normal sleep architecture. - Sleep hygiene should be reviewed to assess factors that may improve sleep quality. - Weight management and regular exercise should  be initiated or continued if appropriate.  [Electronically signed] 08/30/2019 09:46 AM  Nicki Guadalajara MD, [facility], ABSM Diplomate, American Board of Sleep Medicine   NPI: [id number]  [facility] [place] PH: [phone]   FX: [phone] ACCREDITED BY THE AMERICAN ACADEMY OF SLEEP MEDICINE

## 2019-09-07 ENCOUNTER — Other Ambulatory Visit: Payer: Self-pay

## 2019-09-07 MED ORDER — ROSUVASTATIN CALCIUM 20 MG PO TABS: 20.0000 mg | ORAL_TABLET | Freq: Every day | ORAL | 1 refills | Status: DC

## 2019-09-08 DIAGNOSIS — Z23 Encounter for immunization: Secondary | ICD-10-CM | POA: Diagnosis not present

## 2019-09-25 ENCOUNTER — Other Ambulatory Visit: Payer: Self-pay

## 2019-09-25 MED ORDER — NITROGLYCERIN 0.4 MG SL SUBL: 0.4000 mg | SUBLINGUAL_TABLET | SUBLINGUAL | 4 refills | Status: DC | PRN

## 2019-10-16 DIAGNOSIS — L03114 Cellulitis of left upper limb: Secondary | ICD-10-CM | POA: Diagnosis not present

## 2019-11-10 ENCOUNTER — Telehealth: Payer: Self-pay | Admitting: *Deleted

## 2019-11-10 NOTE — Telephone Encounter (Signed)
Sleep study results sent to patient via my chart. 

## 2019-11-16 MED ORDER — NITROGLYCERIN 0.4 MG SL SUBL: 0.4000 mg | SUBLINGUAL_TABLET | SUBLINGUAL | 4 refills | Status: DC | PRN

## 2020-01-04 ENCOUNTER — Other Ambulatory Visit: Payer: Self-pay

## 2020-01-04 MED ORDER — METOPROLOL SUCCINATE ER 25 MG PO TB24: 12.5000 mg | ORAL_TABLET | Freq: Every day | ORAL | 7 refills | Status: AC

## 2020-01-04 NOTE — Telephone Encounter (Signed)
Rx(s) sent to pharmacy electronically.  

## 2020-01-25 ENCOUNTER — Other Ambulatory Visit: Payer: Self-pay | Admitting: Cardiology

## 2020-02-16 DIAGNOSIS — I10 Essential (primary) hypertension: Secondary | ICD-10-CM | POA: Diagnosis not present

## 2020-02-16 DIAGNOSIS — F5104 Psychophysiologic insomnia: Secondary | ICD-10-CM | POA: Diagnosis not present

## 2020-02-16 DIAGNOSIS — I25118 Atherosclerotic heart disease of native coronary artery with other forms of angina pectoris: Secondary | ICD-10-CM | POA: Diagnosis not present

## 2020-02-16 DIAGNOSIS — Z23 Encounter for immunization: Secondary | ICD-10-CM | POA: Diagnosis not present

## 2020-02-16 DIAGNOSIS — E7800 Pure hypercholesterolemia, unspecified: Secondary | ICD-10-CM | POA: Diagnosis not present

## 2020-02-19 DIAGNOSIS — E7800 Pure hypercholesterolemia, unspecified: Secondary | ICD-10-CM | POA: Diagnosis not present

## 2020-02-19 DIAGNOSIS — I10 Essential (primary) hypertension: Secondary | ICD-10-CM | POA: Diagnosis not present

## 2020-02-19 DIAGNOSIS — I25118 Atherosclerotic heart disease of native coronary artery with other forms of angina pectoris: Secondary | ICD-10-CM | POA: Diagnosis not present

## 2020-03-29 MED ORDER — ROSUVASTATIN CALCIUM 20 MG PO TABS: 20.0000 mg | ORAL_TABLET | Freq: Every day | ORAL | 1 refills | Status: AC

## 2020-03-30 DIAGNOSIS — M545 Low back pain, unspecified: Secondary | ICD-10-CM | POA: Diagnosis not present

## 2020-04-19 ENCOUNTER — Other Ambulatory Visit: Payer: Self-pay | Admitting: Cardiology

## 2020-05-26 DIAGNOSIS — H5210 Myopia, unspecified eye: Secondary | ICD-10-CM | POA: Diagnosis not present

## 2020-07-22 DIAGNOSIS — M5416 Radiculopathy, lumbar region: Secondary | ICD-10-CM | POA: Diagnosis not present

## 2020-08-02 DIAGNOSIS — M4317 Spondylolisthesis, lumbosacral region: Secondary | ICD-10-CM | POA: Diagnosis not present

## 2020-08-08 ENCOUNTER — Other Ambulatory Visit: Payer: Self-pay

## 2020-08-08 ENCOUNTER — Ambulatory Visit: Payer: BC Managed Care – PPO | Attending: Specialist | Admitting: Physical Therapy

## 2020-08-08 DIAGNOSIS — M5442 Lumbago with sciatica, left side: Secondary | ICD-10-CM | POA: Diagnosis not present

## 2020-08-08 DIAGNOSIS — R293 Abnormal posture: Secondary | ICD-10-CM | POA: Diagnosis not present

## 2020-08-08 DIAGNOSIS — M6281 Muscle weakness (generalized): Secondary | ICD-10-CM | POA: Insufficient documentation

## 2020-08-08 DIAGNOSIS — G8929 Other chronic pain: Secondary | ICD-10-CM | POA: Diagnosis not present

## 2020-08-08 NOTE — Therapy (Signed)
[facility] [address] [place], [place], [address] Phone: [phone]   Fax:  [phone]  Physical Therapy Evaluation  Patient Details  Name: EDMUND HOLCOMB MRN: [id number] Date of Birth: 1966-04-13 Referring Provider (PT): Leandra Kern, MD   Encounter Date: 08/08/2020   PT End of Session - 08/08/20 1948    Visit Number 1    Number of Visits 8    Date for PT Re-Evaluation 09/12/20    Authorization Type BCBS; FOTO; Progress note every 10th visit    PT Start Time 0815    PT Stop Time 0900    PT Time Calculation (min) 45 min    Activity Tolerance Patient tolerated treatment well    Behavior During Therapy [facility] for tasks assessed/performed           Past Medical History:  Diagnosis Date  . Chest discomfort 2014   due to food poisoning/went to hospital per EMS  . Hypertension     Past Surgical History:  Procedure Laterality Date  . CERVICAL DISCECTOMY  2016   C4-5  . CORONARY STENT INTERVENTION N/A 06/22/2019   Procedure: CORONARY STENT INTERVENTION;  Surgeon: Yvonne Kendall, MD;  [facility];  Service: Cardiovascular;  Laterality: N/A;  . LEFT HEART CATH AND CORONARY ANGIOGRAPHY N/A 06/22/2019   Procedure: LEFT HEART CATH AND CORONARY ANGIOGRAPHY;  Surgeon: Yvonne Kendall, MD;  [facility]: [facility];  Service: Cardiovascular;  Laterality: N/A;  . WRIST SURGERY     Left    There were no vitals filed for this visit.    Subjective Assessment - 08/08/20 1932    Subjective COVID-19 screening performed upon arrival. Patient arrives at physical therapy with reports of low back pain that began about five months ago, November 2021. Patient reports some discomfort with ADLs but otherwise independent. Patient reports burning sensation down the lateral aspect of the left leg down to the lateral calf. Patient reports pain with standing for long periods of time or sitting. Patient frequently stands after sitting  greater than one hour to decrease pain as walking helps. Patient has been consistent with avoiding overhead lifting per MD. Patient reports pain at worst as 6/ 10 and pain at best as 3/10. Patient's goals or two decrease pain improve movement.    Pertinent History HTN; Stent 08/2020    Limitations Sitting;Walking;Standing    How long can you sit comfortably? 45 mins-1 hr    How long can you stand comfortably? 30 mins    How long can you walk comfortably? "walking helps"    Diagnostic tests MRI: grade one spondylolisthesis bulging disc osteophyte complex asymmetric to the left mild medial displacement of the descending left L5 nerve root. Moderate to severe left foraminal narrowing impingement upon the exiting of L5 nerve root.    Patient Stated Goals decrease pain    Currently in Pain? Yes    Pain Score 4     Pain Location Back    Pain Orientation Lower;Left    Pain Descriptors / Indicators Burning;Aching;Sharp;Shooting    Pain Type Chronic pain    Pain Radiating Towards left lateral leg to calf    Pain Onset More than a month ago    Pain Frequency Constant    Aggravating Factors  standing or sitting too long, quick impacts on leg    Pain Relieving Factors Advil, gabapentin, walking    Effect of Pain on Daily Activities pain with regular activities, numbness can intensify depending on  activity              [facility] PT Assessment - 08/08/20 0001      Assessment   Medical Diagnosis Low back pain, unspecified    Referring Provider (PT) Leandra Kern, MD    Onset Date/Surgical Date --   5 months ago, November 2021   Next MD Visit 08/23/2020   for injection; then follow up will be made with Dr. Shelle Iron   Prior Therapy no      Precautions   Precautions Other (comment)    Precaution Comments cautious with spinal extension, no lifting overhead      Restrictions   Weight Bearing Restrictions No      Balance Screen   Has the patient fallen in the past 6 months No    Has the patient had a  decrease in activity level because of a fear of falling?  No    Is the patient reluctant to leave their home because of a fear of falling?  No      Home [employer] information [employer] manager residence      Prior Function   Level of Independence Independent    Vocation Part time employment    Herbalist, mainly sitting    Leisure golfing      Observation/Other Assessments   Focus on Therapeutic Outcomes (FOTO)  39% limitation      Posture/Postural Control   Posture/Postural Control Postural limitations    Postural Limitations Increased lumbar lordosis;Decreased thoracic kyphosis;Rounded Shoulders;Forward head      Deep Tendon Reflexes   DTR Assessment Site Patella;Achilles    Patella DTR 2+;1+   1+ left   Achilles DTR 2+   bilaterally     ROM / Strength   AROM / PROM / Strength AROM;Strength      AROM   Overall AROM  Deficits    AROM Assessment Site Lumbar    Lumbar Flexion palms to floor    Lumbar Extension neutral    Lumbar - Right Side Bend 19" finger tip to floor   (+) pain   Lumbar - Left Side Bend 19" finger tip to floor      Strength   Overall Strength Deficits    Strength Assessment Site Hip;Knee    Right/Left Hip Right;Left    Right Hip Flexion 4/5    Left Hip Flexion 4-/5    Right/Left Knee Right;Left    Right Knee Flexion 4+/5    Right Knee Extension 5/5    Left Knee Flexion 4/5    Left Knee Extension 4/5      Palpation   Palpation comment increased thoracolumbar paraspinal tone upon palpation, increase of neurological symptoms when palpating left upper glute and PSIS region      Transfers   Comments independent with transfers and bed mobility      Ambulation/Gait   Gait Pattern Within Functional Limits                      Objective measurements completed on examination: See above findings.               PT Education - 08/08/20 1947    Education Details ab bracing, supine marching with ab bracing,  clamshells sidelying    Person(s) Educated Patient    Methods Explanation;Handout    Comprehension Verbalized understanding               PT Long Term Goals - 08/08/20  2001      PT LONG TERM GOAL #1   Title Patient will be independent with HEP and its progression.    Time 4    Period Weeks    Status New      PT LONG TERM GOAL #2   Title Patient will report a centralization of left LE neurological symptoms to L glute or elimination of symtoms to decrease nerve irritation    Time 4    Period Weeks    Status New      PT LONG TERM GOAL #3   Title Patient will demosntrate 4+/5 bilateral LE MMT to improve stability during functional tasks.    Time 4    Period Weeks    Status New      PT LONG TERM GOAL #4   Title Patient will report ability to perform ADLs, home activities and work activities with low back pain less than or equal to 3/10.    Time 4    Period Weeks    Status New                  Plan - 08/08/20 1948    Clinical Impression Statement Patient is a 55 year old male who presents to physical therapy with reports of low back pain that radiates down the left lower extremity that began about five months ago, November 2021. Patient noted with moderate tone to bilateral lumbar paraspinals and bilateral QLs. Patient reported with increased neurological symptoms with moderate pressure palpation to left upper glutes and PSIS region. Patient (-) Left straight leg raise and slump test. Patient and PT discussed plan of care and discuss HEP to which patient reported understanding. Patient would benefit from skilled physical therapy to address deficits and patient goals.    Personal Factors and Comorbidities Comorbidity 1    Comorbidities Grade 1 L5 spondylolyisthesis, HTN, history of stents, history of cervical surgery    Examination-Activity Limitations Stand;Stairs;Sit    Examination-Participation Restrictions Occupation    Stability/Clinical Decision Making  Evolving/Moderate complexity    Clinical Decision Making Low    Rehab Potential Good    PT Frequency 2x / week    PT Duration 4 weeks    PT Treatment/Interventions ADLs/Self Care Home Management;Cryotherapy;Electrical Stimulation;Moist Heat;Ultrasound;Gait training;Stair training;Functional mobility training;Therapeutic activities;Therapeutic exercise;Balance training;Neuromuscular re-education;Manual techniques;Passive range of motion;Patient/family education    PT Next Visit Plan Nustep or bike, core stability and strengtening, LE strengthening, flexion based activities, STW/M as needed for lumbar paraspinals, modalities PRN for pain relief    PT Home Exercise Plan see patient education section    Consulted and Agree with Plan of Care Patient           Patient will benefit from skilled therapeutic intervention in order to improve the following deficits and impairments:  Decreased activity tolerance,Decreased strength,Pain,Postural dysfunction  Visit Diagnosis: Chronic bilateral low back pain with left-sided sciatica  Muscle weakness (generalized)  Abnormal posture     Problem List Patient Active Problem List   Diagnosis Date Noted  . Coronary artery disease involving native coronary artery of native heart without angina pectoris 08/16/2019  . Chest pain 07/30/2019  . Palpitations 07/02/2019  . Accelerating angina (HCC) 06/22/2019  . Abnormal stress test 06/22/2019  . S/P angioplasty with stent 06/22/2019  . Chest pain of uncertain etiology 05/06/2019  . Educated about COVID-19 virus infection 05/06/2019  . Hyperlipidemia 02/21/2011  . Hypertension   . Chest discomfort     Guss Bunde, PT, DPT 08/08/2020,  8:05 PM  [facility] [address] [place], [place], [address] Phone: [phone]   Fax:  [phone]  Name: Imagene RichesDonald R Kosch MRN: [id number] Date of Birth: 06/11/1965

## 2020-08-10 ENCOUNTER — Encounter: Payer: Self-pay | Admitting: Physical Therapy

## 2020-08-10 ENCOUNTER — Ambulatory Visit: Payer: BC Managed Care – PPO | Admitting: Physical Therapy

## 2020-08-10 ENCOUNTER — Other Ambulatory Visit: Payer: Self-pay

## 2020-08-10 DIAGNOSIS — M5442 Lumbago with sciatica, left side: Secondary | ICD-10-CM | POA: Diagnosis not present

## 2020-08-10 DIAGNOSIS — G8929 Other chronic pain: Secondary | ICD-10-CM | POA: Diagnosis not present

## 2020-08-10 DIAGNOSIS — R293 Abnormal posture: Secondary | ICD-10-CM | POA: Diagnosis not present

## 2020-08-10 DIAGNOSIS — M6281 Muscle weakness (generalized): Secondary | ICD-10-CM | POA: Diagnosis not present

## 2020-08-10 NOTE — Therapy (Signed)
[facility] [address] [place], [place], [address] Phone: [phone]   Fax:  [phone]  Physical Therapy Treatment  Patient Details  Name: Carl Kim MRN: [id number] Date of Birth: January 14, 1966 Referring Provider (PT): Leandra Kern, MD   Encounter Date: 08/10/2020   PT End of Session - 08/10/20 0755    Visit Number 2    Number of Visits 8    Date for PT Re-Evaluation 09/12/20    Authorization Type BCBS; FOTO; Progress note every 10th visit    PT Start Time 0730    PT Stop Time 0820    PT Time Calculation (min) 50 min    Activity Tolerance Patient tolerated treatment well    Behavior During Therapy [facility] for tasks assessed/performed           Past Medical History:  Diagnosis Date  . Chest discomfort 2014   due to food poisoning/went to hospital per EMS  . Hypertension     Past Surgical History:  Procedure Laterality Date  . CERVICAL DISCECTOMY  2016   C4-5  . CORONARY STENT INTERVENTION N/A 06/22/2019   Procedure: CORONARY STENT INTERVENTION;  Surgeon: Yvonne Kendall, MD;  [facility]: [facility];  Service: Cardiovascular;  Laterality: N/A;  . LEFT HEART CATH AND CORONARY ANGIOGRAPHY N/A 06/22/2019   Procedure: LEFT HEART CATH AND CORONARY ANGIOGRAPHY;  Surgeon: Yvonne Kendall, MD;  [facility]: [facility];  Service: Cardiovascular;  Laterality: N/A;  . WRIST SURGERY     Left    There were no vitals filed for this visit.   Subjective Assessment - 08/10/20 0740    Subjective COVID-19 screening performed upon arrival. Patient arrives with about 4/10 in low back today. Patient reports doing HEP.    Pertinent History HTN; Stent 08/2020    Limitations Sitting;Walking;Standing    How long can you sit comfortably? 45 mins-1 hr    How long can you stand comfortably? 30 mins    How long can you walk comfortably? "walking helps"    Diagnostic tests MRI: grade one spondylolisthesis bulging disc osteophyte complex  asymmetric to the left mild medial displacement of the descending left L5 nerve root. Moderate to severe left foraminal narrowing impingement upon the exiting of L5 nerve root.    Patient Stated Goals decrease pain    Currently in Pain? Yes    Pain Score 4     Pain Location Back    Pain Orientation Lower    Pain Type Chronic pain    Pain Onset More than a month ago    Pain Frequency Constant              [facility] PT Assessment - 08/10/20 0001      Assessment   Medical Diagnosis Low back pain, unspecified    Referring Provider (PT) Leandra Kern, MD    Next MD Visit 08/23/2020    Prior Therapy no      Precautions   Precautions Other (comment)    Precaution Comments cautious with spinal extension, no lifting overhead                         [facility] Adult PT Treatment/Exercise - 08/10/20 0001      Exercises   Exercises Lumbar      Lumbar Exercises: Stretches   Figure 4 Stretch 4 reps;30 seconds;Supine;With overpressure   as well as with hip flexion     Lumbar Exercises: Aerobic  Recumbent Bike Level 4 x10 mins      Lumbar Exercises: Supine   Bent Knee Raise 15 reps;3 seconds    Bent Knee Raise Limitations followed by 90/90 x20    Bridge 15 reps;3 seconds    Straight Leg Raise 20 reps      Lumbar Exercises: Quadruped   Single Arm Raise Right;Left;20 reps;3 seconds    Straight Leg Raise 20 reps;3 seconds                  PT Education - 08/10/20 0827    Education Details anatomy of spine, spondylolisthesis, and anatomy of spinal cord and nerves    Person(s) Educated Patient    Methods Explanation    Comprehension Verbalized understanding               PT Long Term Goals - 08/08/20 2001      PT LONG TERM GOAL #1   Title Patient will be independent with HEP and its progression.    Time 4    Period Weeks    Status New      PT LONG TERM GOAL #2   Title Patient will report a centralization of left LE neurological symptoms to L glute or  elimination of symtoms to decrease nerve irritation    Time 4    Period Weeks    Status New      PT LONG TERM GOAL #3   Title Patient will demosntrate 4+/5 bilateral LE MMT to improve stability during functional tasks.    Time 4    Period Weeks    Status New      PT LONG TERM GOAL #4   Title Patient will report ability to perform ADLs, home activities and work activities with low back pain less than or equal to 3/10.    Time 4    Period Weeks    Status New                 Plan - 08/10/20 1610    Clinical Impression Statement Patient responded well to therapy session with no reports of increased pain but just weakness in low back. Patient guided through TEs with excellent form and technique. Patient denied any increase of neurological symptoms throughout session. Patient educated on spondylolesthesis as well as signs to call doctor such as a loss of bowel/bladder function loss of sensation and motor control. Patient reported understanding.    Personal Factors and Comorbidities Comorbidity 1    Comorbidities Grade 1 L5 spondylolyisthesis, HTN, history of stents, history of cervical surgery    Examination-Activity Limitations Stand;Stairs;Sit    Examination-Participation Restrictions Occupation    Stability/Clinical Decision Making Evolving/Moderate complexity    Clinical Decision Making Low    Rehab Potential Good    PT Frequency 2x / week    PT Duration 4 weeks    PT Treatment/Interventions ADLs/Self Care Home Management;Cryotherapy;Electrical Stimulation;Moist Heat;Ultrasound;Gait training;Stair training;Functional mobility training;Therapeutic activities;Therapeutic exercise;Balance training;Neuromuscular re-education;Manual techniques;Passive range of motion;Patient/family education    PT Next Visit Plan Nustep or bike, core stability and strengtening, LE strengthening, flexion based activities, STW/M as needed for lumbar paraspinals, modalities PRN for pain relief    PT Home  Exercise Plan see patient education section    Consulted and Agree with Plan of Care Patient           Patient will benefit from skilled therapeutic intervention in order to improve the following deficits and impairments:  Decreased activity tolerance,Decreased strength,Pain,Postural dysfunction  Visit  Diagnosis: Chronic bilateral low back pain with left-sided sciatica  Muscle weakness (generalized)  Abnormal posture     Problem List Patient Active Problem List   Diagnosis Date Noted  . Coronary artery disease involving native coronary artery of native heart without angina pectoris 08/16/2019  . Chest pain 07/30/2019  . Palpitations 07/02/2019  . Accelerating angina (HCC) 06/22/2019  . Abnormal stress test 06/22/2019  . S/P angioplasty with stent 06/22/2019  . Chest pain of uncertain etiology 05/06/2019  . Educated about COVID-19 virus infection 05/06/2019  . Hyperlipidemia 02/21/2011  . Hypertension   . Chest discomfort     Guss Bunde, PT, DPT 08/10/2020, 8:36 AM  [facility] [address] [place], [place], [address] Phone: [phone]   Fax:  [phone]  Name: Carl Kim MRN: [id number] Date of Birth: 1965/07/11

## 2020-08-16 ENCOUNTER — Other Ambulatory Visit: Payer: Self-pay

## 2020-08-16 ENCOUNTER — Encounter: Payer: Self-pay | Admitting: Physical Therapy

## 2020-08-16 ENCOUNTER — Ambulatory Visit: Payer: BC Managed Care – PPO | Admitting: Physical Therapy

## 2020-08-16 DIAGNOSIS — R293 Abnormal posture: Secondary | ICD-10-CM | POA: Diagnosis not present

## 2020-08-16 DIAGNOSIS — G8929 Other chronic pain: Secondary | ICD-10-CM | POA: Diagnosis not present

## 2020-08-16 DIAGNOSIS — M6281 Muscle weakness (generalized): Secondary | ICD-10-CM | POA: Diagnosis not present

## 2020-08-16 DIAGNOSIS — M5442 Lumbago with sciatica, left side: Secondary | ICD-10-CM | POA: Diagnosis not present

## 2020-08-16 NOTE — Therapy (Signed)
[facility] [place], [place], [address] Phone: [phone]   Fax:  [phone]  Physical Therapy Treatment  Patient Details  Name: Carl Kim MRN: [id number] Date of Birth: Oct 11, 1965 Referring Provider (PT): Donnald Garre, MD   Encounter Date: 08/16/2020   PT End of Session - 08/16/20 0734    Visit Number 3    Number of Visits 8    Date for PT Re-Evaluation 09/12/20    Authorization Type BCBS; FOTO; Progress note every 10th visit    PT Start Time 0730    PT Stop Time 0816    PT Time Calculation (min) 46 min    Activity Tolerance Patient tolerated treatment well    Behavior During Therapy [facility] for tasks assessed/performed           Past Medical History:  Diagnosis Date  . Chest discomfort 2014   due to food poisoning/went to hospital per EMS  . Hypertension     Past Surgical History:  Procedure Laterality Date  . CERVICAL DISCECTOMY  2016   C4-5  . CORONARY STENT INTERVENTION N/A 06/22/2019   Procedure: CORONARY STENT INTERVENTION;  Surgeon: Nelva Bush, MD;  Location: [place] CV LAB;  Service: Cardiovascular;  Laterality: N/A;  . LEFT HEART CATH AND CORONARY ANGIOGRAPHY N/A 06/22/2019   Procedure: LEFT HEART CATH AND CORONARY ANGIOGRAPHY;  Surgeon: Nelva Bush, MD;  Location: [place] CV LAB;  Service: Cardiovascular;  Laterality: N/A;  . WRIST SURGERY     Left    There were no vitals filed for this visit.   Subjective Assessment - 08/16/20 0733    Subjective COVID-19 screening performed upon arrival. Reports HEP compliance and intermittant radiation/burning down lateral aspect of LLE.    Pertinent History HTN; Stent 08/2020    Limitations Sitting;Walking;Standing    How long can you sit comfortably? 45 mins-1 hr    How long can you stand comfortably? 30 mins    How long can you walk comfortably? "walking helps"    Diagnostic tests MRI: grade one spondylolisthesis bulging disc osteophyte  complex asymmetric to the left mild medial displacement of the descending left L5 nerve root. Moderate to severe left foraminal narrowing impingement upon the exiting of L5 nerve root.    Patient Stated Goals decrease pain    Currently in Pain? No/denies              [facility] PT Assessment - 08/16/20 0001      Assessment   Medical Diagnosis Low back pain, unspecified    Referring Provider (PT) Donnald Garre, MD    Next MD Visit 08/23/2020    Prior Therapy no      Precautions   Precautions Other (comment)    Precaution Comments cautious with spinal extension, no lifting overhead      Restrictions   Weight Bearing Restrictions No                         [facility] Adult PT Treatment/Exercise - 08/16/20 0001      Lumbar Exercises: Stretches   Figure 4 Stretch 3 reps;30 seconds;Supine;With overpressure      Lumbar Exercises: Aerobic   Recumbent Bike Level 4 x11 mins      Lumbar Exercises: Standing   Other Standing Lumbar Exercises B chop/lift blue XTS x20 reps each      Lumbar Exercises: Supine   Bent Knee Raise 20 reps    Bent Knee Raise Limitations  90/90    Dead Bug 15 reps;3 seconds    Bridge 20 reps;5 seconds    Straight Leg Raise 20 reps;3 seconds      Lumbar Exercises: Sidelying   Hip Abduction Both;20 reps      Lumbar Exercises: Quadruped   Single Arm Raise Right;Left;20 reps;3 seconds    Straight Leg Raise 20 reps;3 seconds                       PT Long Term Goals - 08/16/20 0735      PT LONG TERM GOAL #1   Title Patient will be independent with HEP and its progression.    Time 4    Period Weeks    Status Partially Met      PT LONG TERM GOAL #2   Title Patient will report a centralization of left LE neurological symptoms to L glute or elimination of symtoms to decrease nerve irritation    Time 4    Period Weeks    Status On-going      PT LONG TERM GOAL #3   Title Patient will demosntrate 4+/5 bilateral LE MMT to improve  stability during functional tasks.    Time 4    Period Weeks    Status On-going      PT LONG TERM GOAL #4   Title Patient will report ability to perform ADLs, home activities and work activities with low back pain less than or equal to 3/10.    Time 4    Period Weeks    Status On-going                 Plan - 08/16/20 0830    Clinical Impression Statement Patient presented in clinic with reports of no LBP. Patient does experience burning down lateral LLE to mid calf region but mostly to lateral L hip. Patient compliant with HEP at this time but limited with other activities around his home to avoid aggravation. Patient able to complete all therex with good form after initial instructions. Patient did report greater weakness of LLE during therex. All therex completed with core activation instructions. No worsening symptoms reported at end of treatment.    Personal Factors and Comorbidities Comorbidity 1    Comorbidities Grade 1 L5 spondylolyisthesis, HTN, history of stents, history of cervical surgery    Examination-Activity Limitations Stand;Stairs;Sit    Examination-Participation Restrictions Occupation    Stability/Clinical Decision Making Evolving/Moderate complexity    Rehab Potential Good    PT Frequency 2x / week    PT Duration 4 weeks    PT Treatment/Interventions ADLs/Self Care Home Management;Cryotherapy;Electrical Stimulation;Moist Heat;Ultrasound;Gait training;Stair training;Functional mobility training;Therapeutic activities;Therapeutic exercise;Balance training;Neuromuscular re-education;Manual techniques;Passive range of motion;Patient/family education    PT Next Visit Plan Nustep or bike, core stability and strengtening, LE strengthening, flexion based activities, STW/M as needed for lumbar paraspinals, modalities PRN for pain relief    PT Home Exercise Plan see patient education section    Consulted and Agree with Plan of Care Patient           Patient will  benefit from skilled therapeutic intervention in order to improve the following deficits and impairments:  Decreased activity tolerance,Decreased strength,Pain,Postural dysfunction  Visit Diagnosis: Chronic bilateral low back pain with left-sided sciatica  Muscle weakness (generalized)  Abnormal posture     Problem List Patient Active Problem List   Diagnosis Date Noted  . Coronary artery disease involving native coronary artery of native heart without angina  pectoris 08/16/2019  . Chest pain 07/30/2019  . Palpitations 07/02/2019  . Accelerating angina ([place]) 06/22/2019  . Abnormal stress test 06/22/2019  . S/P angioplasty with stent 06/22/2019  . Chest pain of uncertain etiology 90/21/1155  . Educated about COVID-19 virus infection 05/06/2019  . Hyperlipidemia 02/21/2011  . Hypertension   . Chest discomfort     Standley Brooking, PTA 08/16/2020, 8:44 AM  [facility] [address] [place], [place], [address] Phone: [phone]   Fax:  [phone]  Name: ROMMEL HOGSTON MRN: [id number] Date of Birth: [birth year]

## 2020-08-18 ENCOUNTER — Other Ambulatory Visit: Payer: Self-pay

## 2020-08-18 ENCOUNTER — Ambulatory Visit: Payer: BC Managed Care – PPO | Admitting: Physical Therapy

## 2020-08-18 DIAGNOSIS — M5442 Lumbago with sciatica, left side: Secondary | ICD-10-CM | POA: Diagnosis not present

## 2020-08-18 DIAGNOSIS — M6281 Muscle weakness (generalized): Secondary | ICD-10-CM | POA: Diagnosis not present

## 2020-08-18 DIAGNOSIS — R293 Abnormal posture: Secondary | ICD-10-CM

## 2020-08-18 DIAGNOSIS — G8929 Other chronic pain: Secondary | ICD-10-CM

## 2020-08-18 NOTE — Therapy (Signed)
[facility] [place], [place], [address] Phone: [phone]   Fax:  [phone]  Physical Therapy Treatment  Patient Details  Name: Carl Kim MRN: [id number] Date of Birth: June 26, 1965 Referring Provider (PT): Donnald Garre, MD   Encounter Date: 08/18/2020   PT End of Session - 08/18/20 0741    Visit Number 4    Number of Visits 8    Date for PT Re-Evaluation 09/12/20    Authorization Type BCBS; FOTO; Progress note every 10th visit    PT Start Time 0730    PT Stop Time 0815    PT Time Calculation (min) 45 min    Activity Tolerance Patient tolerated treatment well    Behavior During Therapy [facility] for tasks assessed/performed           Past Medical History:  Diagnosis Date  . Chest discomfort 2014   due to food poisoning/went to hospital per EMS  . Hypertension     Past Surgical History:  Procedure Laterality Date  . CERVICAL DISCECTOMY  2016   C4-5  . CORONARY STENT INTERVENTION N/A 06/22/2019   Procedure: CORONARY STENT INTERVENTION;  Surgeon: Nelva Bush, MD;  Location: [place] [facility];  Service: Cardiovascular;  Laterality: N/A;  . LEFT HEART CATH AND CORONARY ANGIOGRAPHY N/A 06/22/2019   Procedure: LEFT HEART CATH AND CORONARY ANGIOGRAPHY;  Surgeon: Nelva Bush, MD;  Location: [place] [facility];  Service: Cardiovascular;  Laterality: N/A;  . WRIST SURGERY     Left    There were no vitals filed for this visit.   Subjective Assessment - 08/18/20 0740    Subjective COVID-19 screening performed upon arrival. Reports playing golf on Tuesday and had radiating sx to L foot. States better but sore today, 5/10.    Pertinent History HTN; Stent 08/2020    Limitations Sitting;Walking;Standing    How long can you sit comfortably? 45 mins-1 hr    How long can you stand comfortably? 30 mins    How long can you walk comfortably? "walking helps"    Diagnostic tests MRI: grade one spondylolisthesis bulging  disc osteophyte complex asymmetric to the left mild medial displacement of the descending left L5 nerve root. Moderate to severe left foraminal narrowing impingement upon the exiting of L5 nerve root.    Patient Stated Goals decrease pain    Currently in Pain? Yes    Pain Score 5     Pain Location Back    Pain Orientation Lower    Pain Descriptors / Indicators Sore    Pain Type Chronic pain    Pain Onset More than a month ago    Pain Frequency Constant              [facility] PT Assessment - 08/18/20 0001      Assessment   Medical Diagnosis Low back pain, unspecified    Referring Provider (PT) Donnald Garre, MD    Next MD Visit 08/23/2020    Prior Therapy no      Precautions   Precautions Other (comment)    Precaution Comments cautious with spinal extension, no lifting overhead      Restrictions   Weight Bearing Restrictions No                         [facility] Adult PT Treatment/Exercise - 08/18/20 0001      Exercises   Exercises Lumbar      Lumbar Exercises: Aerobic  Recumbent Bike Level 4 x11 mins, 3 miles      Lumbar Exercises: Standing   Row Strengthening;20 reps    Row Limitations low row and mid row x20 each    Other Standing Lumbar Exercises B chop/lift blue XTS x30 reps each    Other Standing Lumbar Exercises pallof press x20 blue XTS      Lumbar Exercises: Supine   Bent Knee Raise 20 reps    Bent Knee Raise Limitations 90/90    Bridge with clamshell 20 reps;5 seconds    Bridge with [employer] Limitations Green theraband    Straight Leg Raise 20 reps;3 seconds    Straight Leg Raises Limitations with ER    Other Supine Lumbar Exercises 100s 3x10      Lumbar Exercises: Sidelying   Hip Abduction Both;20 reps                       PT Long Term Goals - 08/16/20 0735      PT LONG TERM GOAL #1   Title Patient will be independent with HEP and its progression.    Time 4    Period Weeks    Status Partially Met      PT LONG TERM  GOAL #2   Title Patient will report a centralization of left LE neurological symptoms to L glute or elimination of symtoms to decrease nerve irritation    Time 4    Period Weeks    Status On-going      PT LONG TERM GOAL #3   Title Patient will demosntrate 4+/5 bilateral LE MMT to improve stability during functional tasks.    Time 4    Period Weeks    Status On-going      PT LONG TERM GOAL #4   Title Patient will report ability to perform ADLs, home activities and work activities with low back pain less than or equal to 3/10.    Time 4    Period Weeks    Status On-going                 Plan - 08/18/20 0810    Clinical Impression Statement Patient arrives with more soreness secondary to playing 18 holes of golf on Tuesday. Patient was able to tolerate treatment well with no reports of increased pain or intensity of neurological symptoms down left LE. Patient provided with green theraband for bridging with clam shell and for standing chop/lift. Patient provided with verbal cuing for posture with high rows with excellent carryover of cuing for remaining reps.    Personal Factors and Comorbidities Comorbidity 1    Comorbidities Grade 1 L5 spondylolyisthesis, HTN, history of stents, history of cervical surgery    Examination-Activity Limitations Stand;Stairs;Sit    Examination-Participation Restrictions Occupation    Stability/Clinical Decision Making Evolving/Moderate complexity    Clinical Decision Making Low    Rehab Potential Good    PT Frequency 2x / week    PT Duration 4 weeks    PT Treatment/Interventions ADLs/Self Care Home Management;Cryotherapy;Electrical Stimulation;Moist Heat;Ultrasound;Gait training;Stair training;Functional mobility training;Therapeutic activities;Therapeutic exercise;Balance training;Neuromuscular re-education;Manual techniques;Passive range of motion;Patient/family education    PT Next Visit Plan Nustep or bike, core stability and strengtening, LE  strengthening, flexion based activities, STW/M as needed for lumbar paraspinals, modalities PRN for pain relief    PT Home Exercise Plan see patient education section    Consulted and Agree with Plan of Care Patient  Patient will benefit from skilled therapeutic intervention in order to improve the following deficits and impairments:  Decreased activity tolerance,Decreased strength,Pain,Postural dysfunction  Visit Diagnosis: Chronic bilateral low back pain with left-sided sciatica  Muscle weakness (generalized)  Abnormal posture     Problem List Patient Active Problem List   Diagnosis Date Noted  . Coronary artery disease involving native coronary artery of native heart without angina pectoris 08/16/2019  . Chest pain 07/30/2019  . Palpitations 07/02/2019  . Accelerating angina ([place]) 06/22/2019  . Abnormal stress test 06/22/2019  . S/P angioplasty with stent 06/22/2019  . Chest pain of uncertain etiology 61/84/8592  . Educated about COVID-19 virus infection 05/06/2019  . Hyperlipidemia 02/21/2011  . Hypertension   . Chest discomfort     Gabriela Eves, PT, DPT 08/18/2020, 8:18 AM  [facility] [address] [place], [place], [address] Phone: [phone]   Fax:  [phone]  Name: ZONG MCQUARRIE MRN: [id number] Date of Birth: [birth year]

## 2020-08-21 NOTE — Progress Notes (Signed)
Cardiology Office Note   Date:  08/22/2020   ID:  Carl Kim, DOB 05-03-1966, MRN [id number]  PCP:  Roderick Pee, PA  Cardiologist:   Rollene Rotunda, MD   Chief Complaint  Patient presents with  . Chest Pain      History of Present Illness: Carl Kim is a 55 y.o. male who presents for follow up of CAD. He reported sporadic chest pain that was non-exertional. He underwent calcium score by CT which revealed calcification in the LAD and a calcium score of 42 which placed him in the 78th percentile for age and sex matched controls. He underwent POET which was abnormal revealing ST depressions in inferior and lateral leads. I saw him for pre-cath evaluation. He underwent angiography on 06/22/19 which revealed sequential lesion I the mid LAD of 90% followed by 40% treated with DES, which jailed a D2 branch which had 60% ostial stenosis. He has residual disease in the distal LAD and large OM1 branch. He was discharged on ASA and plavix for at least 6 months.   Since I last saw him he has continued to have some symptoms.  He has had some sharp chest pain.  He is also had some dyspnea with exertion.  Its hard for him to quantify or qualify this.  It is not as severe as it was when he had his angioplasty but he thinks he has had a little more symptoms then immediately after that procedure.  He has not been quite as active because he has some back pain and is getting injections.  He still able to walk slowly and he does some golfing.  He does some stuff around the yard.  He is not necessarily bringing on symptoms but they happen sporadically.  He has not had any severe nausea vomiting or diaphoresis.  He has not had any new palpitations, presyncope or syncope.  He does occasionally have to take a nitroglycerin.   Past Medical History:  Diagnosis Date  . Chest discomfort 2014   due to food poisoning/went to hospital per EMS  . Hypertension     Past Surgical History:  Procedure  Laterality Date  . CERVICAL DISCECTOMY  2016   C4-5  . CORONARY STENT INTERVENTION N/A 06/22/2019   Procedure: CORONARY STENT INTERVENTION;  Surgeon: Yvonne Kendall, MD;  Location: [facility];  Service: Cardiovascular;  Laterality: N/A;  . LEFT HEART CATH AND CORONARY ANGIOGRAPHY N/A 06/22/2019   Procedure: LEFT HEART CATH AND CORONARY ANGIOGRAPHY;  Surgeon: Yvonne Kendall, MD;  Location: [facility];  Service: Cardiovascular;  Laterality: N/A;  . WRIST SURGERY     Left     Current Outpatient Medications  Medication Sig Dispense Refill  . aspirin EC 81 MG tablet Take 1 tablet (81 mg total) by mouth daily with breakfast. 30 tablet 1  . clopidogrel (PLAVIX) 75 MG tablet Take 1 tablet (75 mg total) by mouth daily. 30 tablet 0  . fluticasone (FLONASE) 50 MCG/ACT nasal spray Place 1 spray into both nostrils daily as needed for allergies or rhinitis.    Marland Kitchen gabapentin (NEURONTIN) 300 MG capsule Take 300 mg by mouth at bedtime.    . Ibuprofen (ADVIL) 200 MG CAPS Take by mouth.    Marland Kitchen lisinopril-hydrochlorothiazide (ZESTORETIC) 10-12.5 MG tablet Take 0.5 tablets by mouth daily. 90 tablet 1  . metoprolol succinate (TOPROL-XL) 25 MG 24 hr tablet Take 0.5 tablets (12.5 mg total) by mouth daily. 45 tablet 7  .  Multiple Vitamin (MULTIVITAMIN WITH MINERALS) TABS tablet Take 2 tablets by mouth daily.    . nitroGLYCERIN (NITROSTAT) 0.4 MG SL tablet PLACE 1 TABLET (0.4 MG TOTAL) UNDER THE TONGUE EVERY 5 (FIVE) MINUTES AS NEEDED FOR CHEST PAIN. 90 tablet 3  . pantoprazole (PROTONIX) 40 MG tablet Take 1 tablet (40 mg total) by mouth daily. 30 tablet 1  . rosuvastatin (CRESTOR) 20 MG tablet Take 1 tablet (20 mg total) by mouth daily. 90 tablet 1   No current facility-administered medications for this visit.    Allergies:   Patient has no known allergies.    ROS:  Please see the history of present illness.   Otherwise, review of systems are positive for none.   All other systems are reviewed  and negative.    PHYSICAL EXAM: VS:  BP 126/78   Pulse (!) 56   Ht 5\' 10"  (1.778 m)   Wt 224 lb 12.8 oz (102 kg)   SpO2 98%   BMI 32.26 kg/m  , BMI Body mass index is 32.26 kg/m. GENERAL:  Well appearing NECK:  No jugular venous distention, waveform within normal limits, carotid upstroke brisk and symmetric, no bruits, no thyromegaly LUNGS:  Clear to auscultation bilaterally CHEST:  Unremarkable HEART:  PMI not displaced or sustained,S1 and S2 within normal limits, no S3, no S4, no clicks, no rubs, no murmurs ABD:  Flat, positive bowel sounds normal in frequency in pitch, no bruits, no rebound, no guarding, no midline pulsatile mass, no hepatomegaly, no splenomegaly EXT:  2 plus pulses throughout, no edema, no cyanosis no clubbing   EKG:  EKG is  ordered today. The ekg ordered today demonstrates sinus rhythm, rate 56, axis within normal limits, intervals within normal limits, no acute ST-T wave changes.   Recent Labs: No results found for requested labs within last 8760 hours.    Lipid Panel    Component Value Date/Time   CHOL 91 (L) 08/17/2019 0839   TRIG 76 08/17/2019 0839   HDL 40 08/17/2019 0839   CHOLHDL 2.3 08/17/2019 0839   LDLCALC 35 08/17/2019 0839      Wt Readings from Last 3 Encounters:  08/22/20 224 lb 12.8 oz (102 kg)  08/20/19 195 lb (88.5 kg)  08/17/19 201 lb 3.2 oz (91.3 kg)      Other studies Reviewed: Additional studies/ records that were reviewed today include: Labs   ASSESSMENT AND PLAN:  CAD:   Given the ongoing symptoms I am going to bring him back for a POET (Plain Old Exercise Treadmill).  We talked again about primary risk reduction.  He was going to discontinue the DAPT last year but that this did not happen.  For now we will continue it while he is having symptoms and while I am evaluating him.  DYSLIPIDEMIA: LDL was 59 with an HDL of 41 in January of this year.  No change in therapy.  We talked about plant-based diet.  HTN: The  blood pressure is at target.  No change in therapy.   Current medicines are reviewed at length with the patient today.  The patient does not have concerns regarding medicines.  The following changes have been made:  no change  Labs/ tests ordered today include:   Orders Placed This Encounter  Procedures  . Cardiac Stress Test: Informed Consent Details: Physician/Practitioner Attestation; Transcribe to consent form and obtain patient signature  . EXERCISE TOLERANCE TEST (ETT)  . EKG 12-Lead     Disposition:   FU  with me in 12 months.     Signed, Rollene Rotunda, MD  08/22/2020 9:39 AM    [facility]

## 2020-08-22 ENCOUNTER — Encounter: Payer: Self-pay | Admitting: Cardiology

## 2020-08-22 ENCOUNTER — Ambulatory Visit: Payer: BC Managed Care – PPO | Admitting: Cardiology

## 2020-08-22 ENCOUNTER — Other Ambulatory Visit: Payer: Self-pay

## 2020-08-22 VITALS — BP 126/78 | HR 56 | Ht 70.0 in | Wt 224.8 lb

## 2020-08-22 DIAGNOSIS — E785 Hyperlipidemia, unspecified: Secondary | ICD-10-CM | POA: Diagnosis not present

## 2020-08-22 DIAGNOSIS — R0609 Other forms of dyspnea: Secondary | ICD-10-CM

## 2020-08-22 DIAGNOSIS — I10 Essential (primary) hypertension: Secondary | ICD-10-CM

## 2020-08-22 DIAGNOSIS — I2510 Atherosclerotic heart disease of native coronary artery without angina pectoris: Secondary | ICD-10-CM

## 2020-08-22 DIAGNOSIS — R0600 Dyspnea, unspecified: Secondary | ICD-10-CM

## 2020-08-22 DIAGNOSIS — R079 Chest pain, unspecified: Secondary | ICD-10-CM

## 2020-08-22 NOTE — Patient Instructions (Addendum)
Medication Instructions:  Your physician recommends that you continue on your current medications as directed. Please refer to the Current Medication list given to you today.  *If you need a refill on your cardiac medications before your next appointment, please call your pharmacy*  Testing/Procedures: Your physician has requested that you have an exercise tolerance test. For further information please visit [email or web address]. Please also follow instruction sheet, as given. --this requires a covid test 3 days prior, we will schedule this for you.  Follow-Up: At [facility], you and your health needs are our priority.  As part of our continuing mission to provide you with exceptional heart care, we have created designated Provider Care Teams.  These Care Teams include your primary Cardiologist (physician) and Advanced Practice Providers (APPs -  Physician Assistants and Nurse Practitioners) who all work together to provide you with the care you need, when you need it.  We recommend signing up for the patient portal called "MyChart".  Sign up information is provided on this After Visit Summary.  MyChart is used to connect with patients for Virtual Visits (Telemedicine).  Patients are able to view lab/test results, encounter notes, upcoming appointments, etc.  Non-urgent messages can be sent to your provider as well.   To learn more about what you can do with MyChart, go to [email or web address].    Your next appointment:   12 month(s)  The format for your next appointment:   In Person  Provider:   James Hochrein, MD     

## 2020-08-23 DIAGNOSIS — M5416 Radiculopathy, lumbar region: Secondary | ICD-10-CM | POA: Diagnosis not present

## 2020-08-29 ENCOUNTER — Other Ambulatory Visit (HOSPITAL_COMMUNITY): Payer: BC Managed Care – PPO

## 2020-08-30 ENCOUNTER — Ambulatory Visit: Payer: BC Managed Care – PPO | Admitting: Physical Therapy

## 2020-08-30 ENCOUNTER — Telehealth: Payer: Self-pay | Admitting: Cardiology

## 2020-08-30 ENCOUNTER — Other Ambulatory Visit: Payer: Self-pay

## 2020-08-30 DIAGNOSIS — M6281 Muscle weakness (generalized): Secondary | ICD-10-CM

## 2020-08-30 DIAGNOSIS — G8929 Other chronic pain: Secondary | ICD-10-CM | POA: Diagnosis not present

## 2020-08-30 DIAGNOSIS — R293 Abnormal posture: Secondary | ICD-10-CM

## 2020-08-30 DIAGNOSIS — M5442 Lumbago with sciatica, left side: Secondary | ICD-10-CM | POA: Diagnosis not present

## 2020-08-30 NOTE — Telephone Encounter (Signed)
Can you reschedule? Thank you!

## 2020-08-30 NOTE — Therapy (Signed)
[facility] [place], [place], [address] Phone: [phone]   Fax:  [phone]  Physical Therapy Treatment  Patient Details  Name: Carl Kim MRN: [id number] Date of Birth: 17-Apr-1966 Referring Provider (PT): Donnald Garre, MD   Encounter Date: 08/30/2020   PT End of Session - 08/30/20 0804    Visit Number 5    Number of Visits 8    Date for PT Re-Evaluation 09/12/20    Authorization Type BCBS; FOTO; Progress note every 10th visit    PT Start Time 0733    PT Stop Time 0816    PT Time Calculation (min) 43 min    Activity Tolerance Patient tolerated treatment well    Behavior During Therapy [facility] for tasks assessed/performed           Past Medical History:  Diagnosis Date  . Chest discomfort 2014   due to food poisoning/went to hospital per EMS  . Hypertension     Past Surgical History:  Procedure Laterality Date  . CERVICAL DISCECTOMY  2016   C4-5  . CORONARY STENT INTERVENTION N/A 06/22/2019   Procedure: CORONARY STENT INTERVENTION;  Surgeon: Nelva Bush, MD;  Location: [place] [facility];  Service: Cardiovascular;  Laterality: N/A;  . LEFT HEART CATH AND CORONARY ANGIOGRAPHY N/A 06/22/2019   Procedure: LEFT HEART CATH AND CORONARY ANGIOGRAPHY;  Surgeon: Nelva Bush, MD;  Location: [place] [facility];  Service: Cardiovascular;  Laterality: N/A;  . WRIST SURGERY     Left    There were no vitals filed for this visit.   Subjective Assessment - 08/30/20 0803    Subjective COVID-19 screening performed upon arrival. Patient arrives after having injection last week on 08/23/2020. Patient reports good response with ongoing burning left LE but less frequent and as persistent. No pain currently upon arrival.    Pertinent History HTN; Stent 08/2020    Limitations Sitting;Walking;Standing    How long can you sit comfortably? 45 mins-1 hr    How long can you stand comfortably? 30 mins    How long can you walk  comfortably? "walking helps"    Diagnostic tests MRI: grade one spondylolisthesis bulging disc osteophyte complex asymmetric to the left mild medial displacement of the descending left L5 nerve root. Moderate to severe left foraminal narrowing impingement upon the exiting of L5 nerve root.    Patient Stated Goals decrease pain    Currently in Pain? No/denies              [facility] PT Assessment - 08/30/20 0001      Assessment   Medical Diagnosis Low back pain, unspecified    Referring Provider (PT) Donnald Garre, MD    Next MD Visit "next week"    Prior Therapy no      Precautions   Precautions Other (comment)    Precaution Comments cautious with spinal extension, no lifting overhead                         [facility] Adult PT Treatment/Exercise - 08/30/20 0001      Lumbar Exercises: Aerobic   Recumbent Bike Level 4 x11 mins, 3 miles      Lumbar Exercises: Standing   Row Strengthening;20 reps;10 reps    Row Limitations low row and mid row x30 each blue XTS    Other Standing Lumbar Exercises B chop/lift blue XTS x30 reps each    Other Standing Lumbar Exercises pallof press  x30 bilaterally blue XTS; kneeling D2 flexion x10 each red theraband      Lumbar Exercises: Supine   Dead Bug 10 reps;2 seconds    Dead Bug Limitations with red physioball    Bridge 15 reps;5 seconds;Non-compliant    Bridge Limitations red physioball under heels    Other Supine Lumbar Exercises --      Lumbar Exercises: Sidelying   Other Sidelying Lumbar Exercises sidelying kneeling plank with hip abduction x10 bilaterally      Lumbar Exercises: Quadruped   Opposite Arm/Leg Raise Right arm/Left leg;Left arm/Right leg;10 reps;3 seconds                       PT Long Term Goals - 08/16/20 0735      PT LONG TERM GOAL #1   Title Patient will be independent with HEP and its progression.    Time 4    Period Weeks    Status Partially Met      PT LONG TERM GOAL #2   Title Patient  will report a centralization of left LE neurological symptoms to L glute or elimination of symtoms to decrease nerve irritation    Time 4    Period Weeks    Status On-going      PT LONG TERM GOAL #3   Title Patient will demosntrate 4+/5 bilateral LE MMT to improve stability during functional tasks.    Time 4    Period Weeks    Status On-going      PT LONG TERM GOAL #4   Title Patient will report ability to perform ADLs, home activities and work activities with low back pain less than or equal to 3/10.    Time 4    Period Weeks    Status On-going                 Plan - 08/30/20 5670    Clinical Impression Statement Patient arrives to physical therapy with no pain in low back today. Patient guided through core stability and strengthening exercises with good response and excellent technique. New TEs provided with demonstration with no adverse affects just fatigue. Patient to see MD for follow up next week.    Personal Factors and Comorbidities Comorbidity 1    Comorbidities Grade 1 L5 spondylolyisthesis, HTN, history of stents, history of cervical surgery    Examination-Activity Limitations Stand;Stairs;Sit    Examination-Participation Restrictions Occupation    Stability/Clinical Decision Making Evolving/Moderate complexity    Clinical Decision Making Low    Rehab Potential Good    PT Frequency 2x / week    PT Duration 4 weeks    PT Treatment/Interventions ADLs/Self Care Home Management;Cryotherapy;Electrical Stimulation;Moist Heat;Ultrasound;Gait training;Stair training;Functional mobility training;Therapeutic activities;Therapeutic exercise;Balance training;Neuromuscular re-education;Manual techniques;Passive range of motion;Patient/family education    PT Next Visit Plan MD note next visit; Continue with  bike, progress to elliptical, core stability and strengtening, LE strengthening, flexion based activities, STW/M as needed for lumbar paraspinals, modalities PRN for pain  relief    PT Home Exercise Plan see patient education section    Consulted and Agree with Plan of Care Patient           Patient will benefit from skilled therapeutic intervention in order to improve the following deficits and impairments:  Decreased activity tolerance,Decreased strength,Pain,Postural dysfunction  Visit Diagnosis: Chronic bilateral low back pain with left-sided sciatica  Muscle weakness (generalized)  Abnormal posture     Problem List Patient Active Problem List  Diagnosis Date Noted  . Coronary artery disease involving native coronary artery of native heart without angina pectoris 08/16/2019  . Chest pain 07/30/2019  . Palpitations 07/02/2019  . Accelerating angina ([place]) 06/22/2019  . Abnormal stress test 06/22/2019  . S/P angioplasty with stent 06/22/2019  . Chest pain of uncertain etiology 51/12/1250  . Educated about COVID-19 virus infection 05/06/2019  . Hyperlipidemia 02/21/2011  . Hypertension   . Chest discomfort     Gabriela Eves, PT, DPT 08/30/2020, 8:27 AM  [facility] [address] [place], [place], [address] Phone: [phone]   Fax:  [phone]  Name: Carl Kim MRN: [id number] Date of Birth: [birth year]

## 2020-08-30 NOTE — Telephone Encounter (Signed)
Patient called to reschedule COVID screening (08/29/20  and ETT (08/31/20)---COVID rescheduled to Monday 09/05/20 at 2:30 pm and ETT rescheduled to Wednesday 09/07/20 at 9:45 am---patien voiced his understanding.

## 2020-08-31 ENCOUNTER — Ambulatory Visit (HOSPITAL_COMMUNITY)
Admission: RE | Admit: 2020-08-31 | Payer: BC Managed Care – PPO | Source: Ambulatory Visit | Attending: Cardiology | Admitting: Cardiology

## 2020-09-02 ENCOUNTER — Ambulatory Visit: Payer: BC Managed Care – PPO | Attending: Specialist | Admitting: Physical Therapy

## 2020-09-05 ENCOUNTER — Other Ambulatory Visit (HOSPITAL_COMMUNITY)
Admission: RE | Admit: 2020-09-05 | Discharge: 2020-09-05 | Disposition: A | Discharge status: Home / Self Care | Payer: BC Managed Care – PPO | Source: Ambulatory Visit | Attending: Cardiology | Admitting: Cardiology

## 2020-09-05 DIAGNOSIS — F5104 Psychophysiologic insomnia: Secondary | ICD-10-CM | POA: Diagnosis not present

## 2020-09-05 DIAGNOSIS — I25118 Atherosclerotic heart disease of native coronary artery with other forms of angina pectoris: Secondary | ICD-10-CM | POA: Diagnosis not present

## 2020-09-05 DIAGNOSIS — Z01812 Encounter for preprocedural laboratory examination: Secondary | ICD-10-CM | POA: Insufficient documentation

## 2020-09-05 DIAGNOSIS — Z20822 Contact with and (suspected) exposure to covid-19: Secondary | ICD-10-CM | POA: Insufficient documentation

## 2020-09-05 DIAGNOSIS — I10 Essential (primary) hypertension: Secondary | ICD-10-CM | POA: Diagnosis not present

## 2020-09-05 DIAGNOSIS — Z95820 Peripheral vascular angioplasty status with implants and grafts: Secondary | ICD-10-CM | POA: Diagnosis not present

## 2020-09-05 DIAGNOSIS — Z0001 Encounter for general adult medical examination with abnormal findings: Secondary | ICD-10-CM | POA: Diagnosis not present

## 2020-09-05 DIAGNOSIS — R5383 Other fatigue: Secondary | ICD-10-CM | POA: Diagnosis not present

## 2020-09-05 DIAGNOSIS — R0602 Shortness of breath: Secondary | ICD-10-CM | POA: Diagnosis not present

## 2020-09-05 DIAGNOSIS — Z125 Encounter for screening for malignant neoplasm of prostate: Secondary | ICD-10-CM | POA: Diagnosis not present

## 2020-09-05 DIAGNOSIS — Z806 Family history of leukemia: Secondary | ICD-10-CM | POA: Diagnosis not present

## 2020-09-05 DIAGNOSIS — E7800 Pure hypercholesterolemia, unspecified: Secondary | ICD-10-CM | POA: Diagnosis not present

## 2020-09-05 DIAGNOSIS — Z0000 Encounter for general adult medical examination without abnormal findings: Secondary | ICD-10-CM | POA: Diagnosis not present

## 2020-09-05 DIAGNOSIS — Z1321 Encounter for screening for nutritional disorder: Secondary | ICD-10-CM | POA: Diagnosis not present

## 2020-09-05 DIAGNOSIS — Z7722 Contact with and (suspected) exposure to environmental tobacco smoke (acute) (chronic): Secondary | ICD-10-CM | POA: Diagnosis not present

## 2020-09-05 DIAGNOSIS — Z8052 Family history of malignant neoplasm of bladder: Secondary | ICD-10-CM | POA: Diagnosis not present

## 2020-09-05 DIAGNOSIS — Z79899 Other long term (current) drug therapy: Secondary | ICD-10-CM | POA: Diagnosis not present

## 2020-09-05 DIAGNOSIS — M79672 Pain in left foot: Secondary | ICD-10-CM | POA: Diagnosis not present

## 2020-09-05 DIAGNOSIS — M549 Dorsalgia, unspecified: Secondary | ICD-10-CM | POA: Diagnosis not present

## 2020-09-06 LAB — SARS CORONAVIRUS 2 (TAT 6-24 HRS): SARS Coronavirus 2: NEGATIVE

## 2020-09-07 ENCOUNTER — Ambulatory Visit (HOSPITAL_COMMUNITY)
Admission: RE | Admit: 2020-09-07 | Discharge: 2020-09-07 | Disposition: A | Discharge status: Home / Self Care | Payer: BC Managed Care – PPO | Source: Ambulatory Visit | Attending: Cardiology | Admitting: Cardiology

## 2020-09-07 ENCOUNTER — Other Ambulatory Visit: Payer: Self-pay

## 2020-09-07 DIAGNOSIS — R079 Chest pain, unspecified: Secondary | ICD-10-CM

## 2020-09-07 DIAGNOSIS — I2510 Atherosclerotic heart disease of native coronary artery without angina pectoris: Secondary | ICD-10-CM

## 2020-09-07 DIAGNOSIS — R0609 Other forms of dyspnea: Secondary | ICD-10-CM

## 2020-09-07 DIAGNOSIS — R0600 Dyspnea, unspecified: Secondary | ICD-10-CM

## 2020-09-07 LAB — EXERCISE TOLERANCE TEST
Estimated workload: 13.6 METS
Exercise duration (min): 12 min
Exercise duration (sec): 1 s
MPHR: 167 {beats}/min
Peak HR: 169 {beats}/min
Percent HR: 101 %
Rest HR: 75 {beats}/min

## 2020-09-15 ENCOUNTER — Ambulatory Visit: Payer: BC Managed Care – PPO | Admitting: Podiatry

## 2020-09-15 ENCOUNTER — Ambulatory Visit (INDEPENDENT_AMBULATORY_CARE_PROVIDER_SITE_OTHER): Payer: BC Managed Care – PPO

## 2020-09-15 ENCOUNTER — Other Ambulatory Visit: Payer: Self-pay | Admitting: Podiatry

## 2020-09-15 ENCOUNTER — Encounter: Payer: Self-pay | Admitting: Podiatry

## 2020-09-15 ENCOUNTER — Other Ambulatory Visit: Payer: Self-pay

## 2020-09-15 DIAGNOSIS — M79672 Pain in left foot: Secondary | ICD-10-CM

## 2020-09-15 DIAGNOSIS — M722 Plantar fascial fibromatosis: Secondary | ICD-10-CM

## 2020-09-15 DIAGNOSIS — M21619 Bunion of unspecified foot: Secondary | ICD-10-CM | POA: Diagnosis not present

## 2020-09-15 NOTE — Patient Instructions (Signed)
Bunion A bunion (hallux valgus) is a bump that forms slowly on the inner side of the big toe joint. It occurs when the big toe turns toward the second toe. Bunions may be small at first, but they often get larger over time. They can make walking painful. What are the causes? This condition may be caused by:  Wearing narrow or pointed shoes that force the big toe to press against the other toes.  Abnormal foot development that causes the foot to roll inward.  Changes in the foot that are caused by certain diseases, such as rheumatoid arthritis or polio.  A foot injury. What increases the risk? The following factors may make you more likely to develop this condition:  Wearing shoes that squeeze the toes together.  Having certain diseases, such as: ? Rheumatoid arthritis. ? Polio. ? Cerebral palsy.  Having family members who have bunions.  Being born with abnormally shaped feet (a foot deformity), such as flat feet or low arches.  Doing activities that put a lot of pressure on the feet, such as ballet dancing. What are the signs or symptoms? The main symptom of this condition is a bump on your big toe that you can notice. Other symptoms may include:  Pain.  Redness and inflammation around your big toe.  Thick or hardened skin on your big toe or between your toes.  Stiffness or loss of motion in your big toe.  Trouble with walking.   How is this diagnosed? This condition may be diagnosed based on your symptoms, medical history, and activities. You may also have tests and imaging, such as:  X-rays. These allow your health care provider to check the position of the bones in your foot and look for damage to your joint. They also help your health care provider determine the severity of your bunion and the best way to treat it.  Joint aspiration. In this test, a sample of fluid is removed from the toe joint. This test may be done if you are in a lot of pain. It helps rule out  diseases that cause painful swelling of the joints, such as arthritis or gout. How is this treated? Treatment depends on the severity of your symptoms. The goal of treatment is to relieve symptoms and prevent your bunion from getting worse. Your health care provider may recommend:  Wearing shoes that have a wide toe box, or using bunion pads to cushion the affected area.  Taping your toes together to keep them in a normal position.  Placing a device inside your shoe (orthotic device) to help reduce pressure on your toe joint.  Taking medicine to ease pain and inflammation.  Putting ice or heat on the affected area.  Doing stretching exercises.  Surgery, for severe cases. Follow these instructions at home: Managing pain, stiffness, and swelling  If directed, put ice on the painful area. To do this: ? Put ice in a plastic bag. ? Place a towel between your skin and the bag. ? Leave the ice on for 20 minutes, 2-3 times a day. ? Remove the ice if your skin turns bright red. This is very important. If you cannot feel pain, heat, or cold, you have a greater risk of damage to the area.  If directed, apply heat to the affected area before you exercise. Use the heat source that your health care provider recommends, such as a moist heat pack or a heating pad. ? Place a towel between your skin and the   heat source. ? Leave the heat on for 20-30 minutes. ? Remove the heat if your skin turns bright red. This is especially important if you are unable to feel pain, heat, or cold. You have a greater risk of getting burned.      General instructions  Do exercises as told by your health care provider.  Support your toe joint with proper footwear, shoe padding, or taping as told by your health care provider.  Take over-the-counter and prescription medicines only as told by your health care provider.  Do not use any products that contain nicotine or tobacco, such as cigarettes, e-cigarettes, and  chewing tobacco. If you need help quitting, ask your health care provider.  Keep all follow-up visits. This is important. Contact a health care provider if:  Your symptoms get worse.  Your symptoms do not improve in 2 weeks. Get help right away if:  You have severe pain and trouble with walking. Summary  A bunion is a bump on the inner side of the big toe joint that forms when the big toe turns toward the second toe.  Bunions can make walking painful.  Treatment depends on the severity of your symptoms.  Support your toe joint with proper footwear, shoe padding, or taping as told by your health care provider. This information is not intended to replace advice given to you by your health care provider. Make sure you discuss any questions you have with your health care provider. Document Revised: 09/25/2019 Document Reviewed: 09/25/2019 Elsevier Patient Education  2021 Elsevier Inc.  

## 2020-09-18 NOTE — Progress Notes (Signed)
Subjective:   Patient ID: Carl Kim, male   DOB: 55 y.o.   MRN: [id number]   HPI Patient presents stating he has had a lot of pain around his big toe joint left and also has a bunion deformity and is developing a nodule underneath the left forefoot on the plantar fascial surface.  States that he knows he is getting need surgery on this but needs to wait till after a trip to [place] and would like treatment to try to help the inflammation before he goes on his golfing trip.  Patient does not smoke likes to be active   Review of Systems  All other systems reviewed and are negative.       Objective:  Physical Exam Vitals and nursing note reviewed.  Constitutional:      Appearance: He is well-developed.  Pulmonary:     Effort: Pulmonary effort is normal.  Musculoskeletal:        General: Normal range of motion.  Skin:    General: Skin is warm.  Neurological:     Mental Status: He is alert.     Neurovascular status intact muscle strength was found to be adequate range of motion adequate.  Patient is found to have significant bunion deformity left with prominence around the first metatarsal head with inflammation of the joint it was found to have a small nodule the plantar aspect of the left fascia distal near the insertion of the head of the first metatarsal.  It is localized with no active swelling noted.  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  I discussed procedure he wants to have this done but wants to wait till after his trip and we are going to try to do a injection around the joint first MPJ to reduce the inflammation before he goes on his trip and he will be back in middle of May we will also talk about the  surgery  X-rays indicate there is moderate elevation of the intermetatarsal angle 1 2 left with no indications of calcification of the tendon

## 2020-10-02 DIAGNOSIS — H40033 Anatomical narrow angle, bilateral: Secondary | ICD-10-CM | POA: Diagnosis not present

## 2020-10-02 DIAGNOSIS — H5203 Hypermetropia, bilateral: Secondary | ICD-10-CM | POA: Diagnosis not present

## 2020-10-03 DIAGNOSIS — M4317 Spondylolisthesis, lumbosacral region: Secondary | ICD-10-CM | POA: Diagnosis not present

## 2020-10-03 DIAGNOSIS — M5136 Other intervertebral disc degeneration, lumbar region: Secondary | ICD-10-CM | POA: Diagnosis not present

## 2020-10-04 DIAGNOSIS — M5416 Radiculopathy, lumbar region: Secondary | ICD-10-CM | POA: Diagnosis not present

## 2020-10-10 DIAGNOSIS — M21372 Foot drop, left foot: Secondary | ICD-10-CM | POA: Diagnosis not present

## 2020-10-10 DIAGNOSIS — M5416 Radiculopathy, lumbar region: Secondary | ICD-10-CM | POA: Diagnosis not present

## 2020-10-18 DIAGNOSIS — M5136 Other intervertebral disc degeneration, lumbar region: Secondary | ICD-10-CM | POA: Diagnosis not present

## 2020-10-20 ENCOUNTER — Ambulatory Visit: Payer: BC Managed Care – PPO | Admitting: Podiatry

## 2020-10-20 ENCOUNTER — Encounter: Payer: Self-pay | Admitting: Podiatry

## 2020-10-20 ENCOUNTER — Other Ambulatory Visit: Payer: Self-pay

## 2020-10-20 DIAGNOSIS — M21372 Foot drop, left foot: Secondary | ICD-10-CM

## 2020-10-20 DIAGNOSIS — M779 Enthesopathy, unspecified: Secondary | ICD-10-CM | POA: Diagnosis not present

## 2020-10-20 DIAGNOSIS — M21619 Bunion of unspecified foot: Secondary | ICD-10-CM

## 2020-10-20 MED ORDER — TRIAMCINOLONE ACETONIDE 10 MG/ML IJ SUSP
10.0000 mg | Freq: Once | INTRAMUSCULAR | Status: AC
  Administered 2020-10-20: 10 mg

## 2020-10-20 MED ORDER — DICLOFENAC SODIUM 75 MG PO TBEC: 75.0000 mg | DELAYED_RELEASE_TABLET | Freq: Two times a day (BID) | ORAL | 2 refills | Status: DC

## 2020-10-20 NOTE — Progress Notes (Signed)
Subjective:   Patient ID: Carl Kim, male   DOB: 55 y.o.   MRN: [id number]   HPI Patient presents stating he is getting ready to go on a golf trip and his big toe joints been sore he knows he needs surgery long-term he wants something short-term and also in the last few weeks he has developed foot drop and is using a brace and has questions concerning this   ROS      Objective:  Physical Exam  Neurovascular status intact with mild weakness anterior tibial tendon left but functioning and extensor weakness mild with patient found to have inflammation of the first MPJ left fluid buildup and is being seen by a physician related to his back and has had MRI with possibility for fusion in the future     Assessment:  Disc issue at L5-S1 creating probability of muscle issues and weakness along with inflammatory capsulitis of the first MPJ left      Plan:  H&P discussed brace in a long-term and recommended a AFO customized brace if symptoms persist that we will make.  Continue physical therapy I advised him on strengthening exercises to do at this time.  I then did sterile prep and injected around the first MPJ 3 mg Kenalog 5 mg Xylocaine to reduce inflammation and reappoint to recheck

## 2020-11-03 DIAGNOSIS — M5416 Radiculopathy, lumbar region: Secondary | ICD-10-CM | POA: Diagnosis not present

## 2020-11-21 DIAGNOSIS — M5416 Radiculopathy, lumbar region: Secondary | ICD-10-CM | POA: Diagnosis not present

## 2021-01-10 IMAGING — CT CT HEART SCORING
2 series · 16 of 20 positions shown, 18 images · non-contrast
Comparison: None.
COMPARISON: None.

Addendum:
EXAM:
OVER-READ INTERPRETATION CT CHEST

The following report is an over-read performed by radiologist Dr.
over-read does not include interpretation of cardiac or coronary
anatomy or pathology. The coronary calcium score interpretation by
the cardiologist is attached.
CLINICAL DATA: Risk stratification
Coronary Calcium Score
TECHNIQUE: The patient was scanned on a Siemens Force scanner. Axial
non-contrast 3 mm slices were carried out through the heart. The
data set was analyzed on a dedicated work station and scored using
the Agatson method.

[Series 2: casc 3.0 i36f 2 bestdiast 69 % · axial · 0.38mm/px · z∈[-288,-184]mm · 8 of 47 slices shown, 10 images]
[im 6/47  vessel]
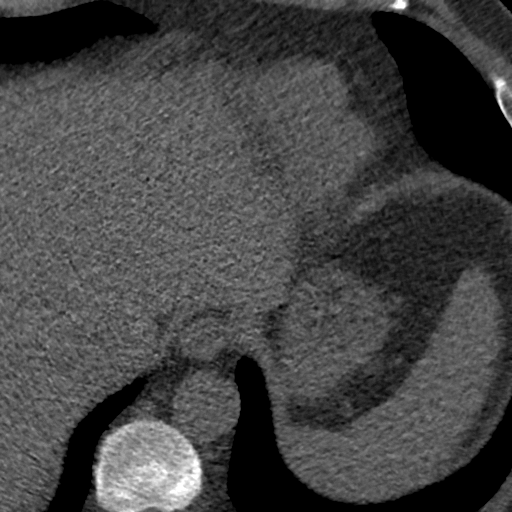
[im 6/47  lung]
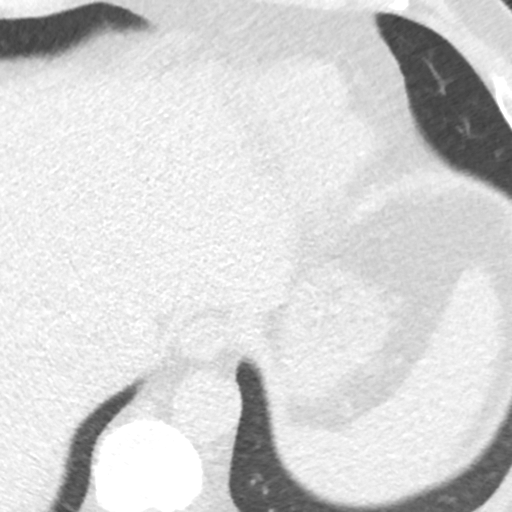
[im 11/47  vessel]
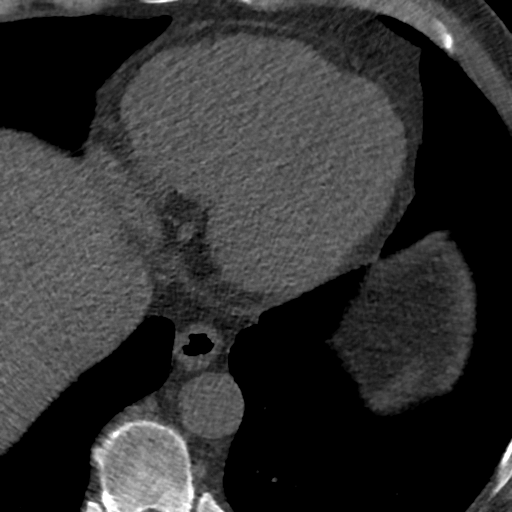
[im 16/47  vessel]
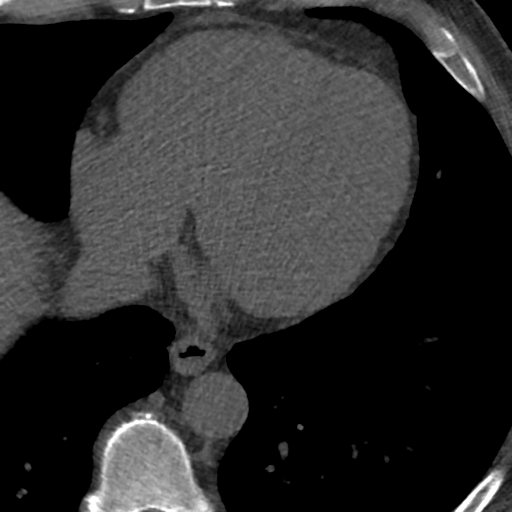
[im 21/47  vessel]
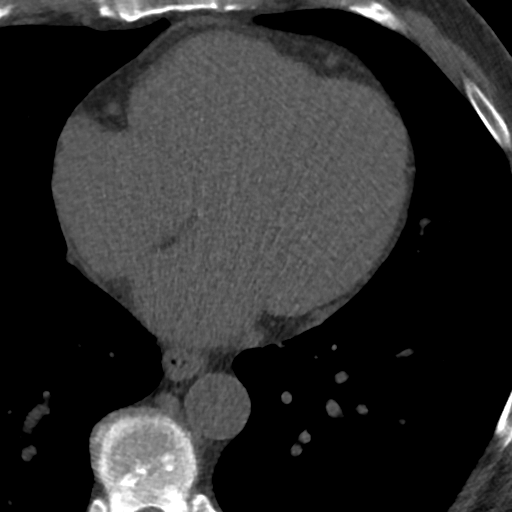
[im 26/47  vessel]
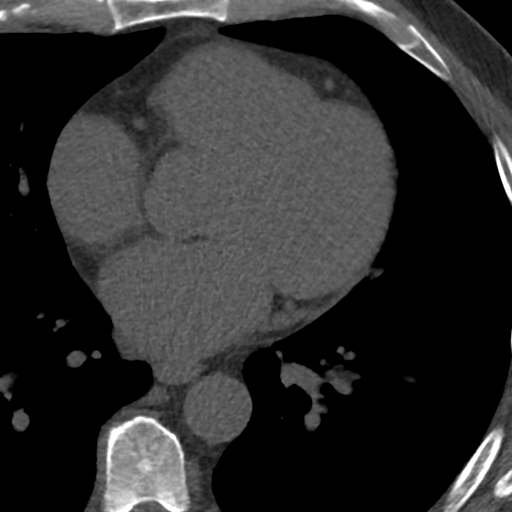
[im 26/47  lung]
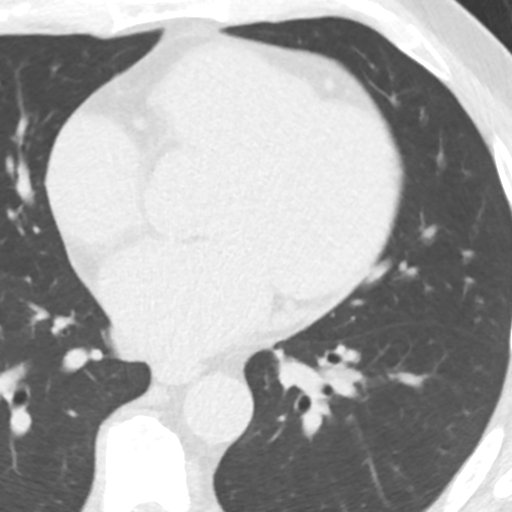
[im 31/47  vessel]
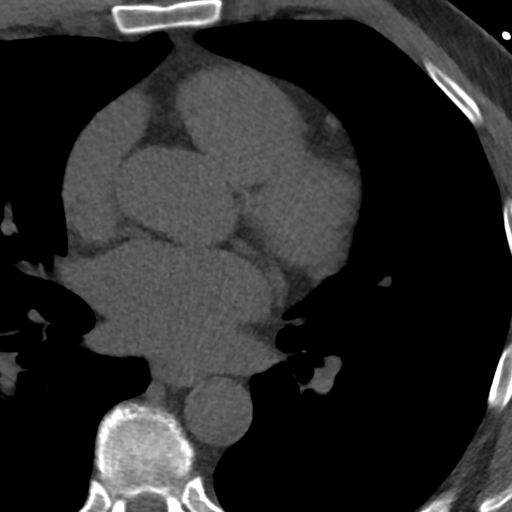
[im 36/47  vessel]
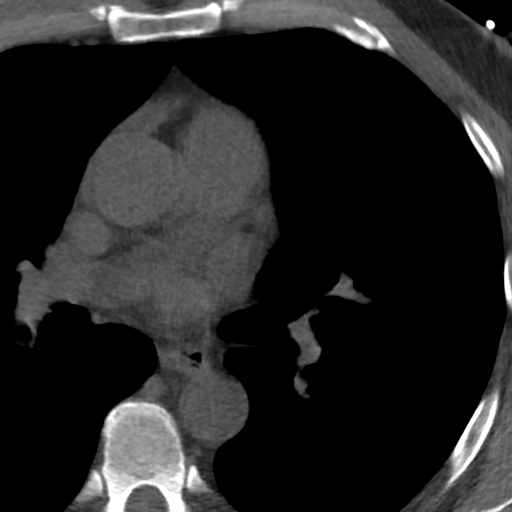
[im 41/47  vessel]
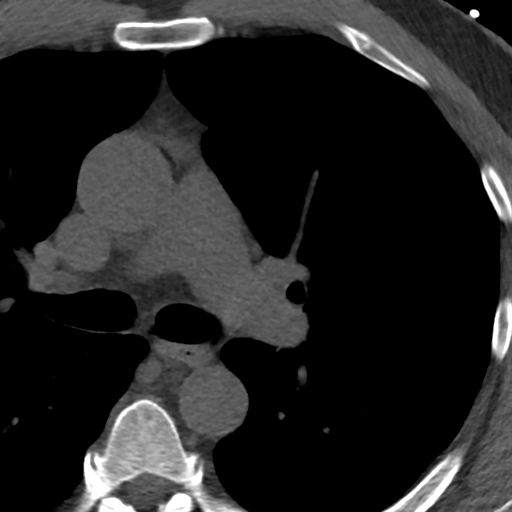

[Series 4: lung st 69 % · axial · 0.72mm/px · z∈[-288,-184]mm · 8 of 47 slices shown]
[im 6/47  lung]
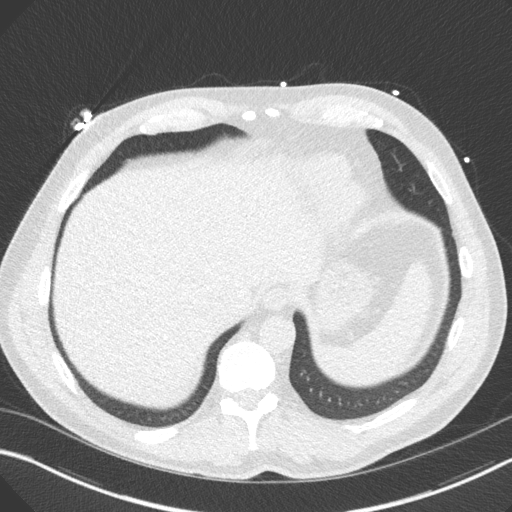
[im 11/47  lung]
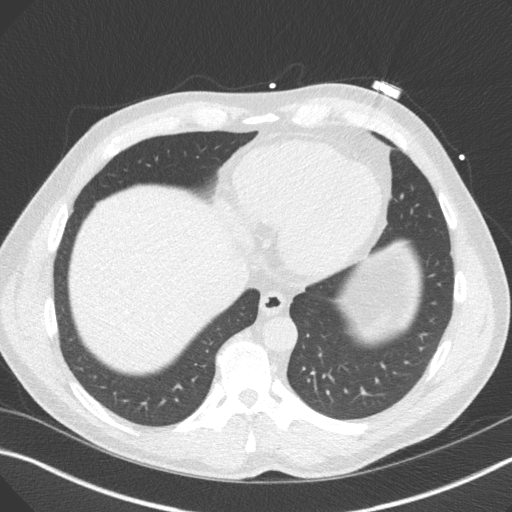
[im 16/47  lung]
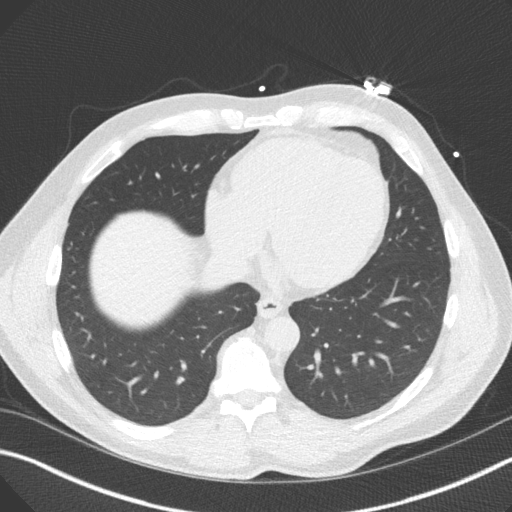
[im 21/47  lung]
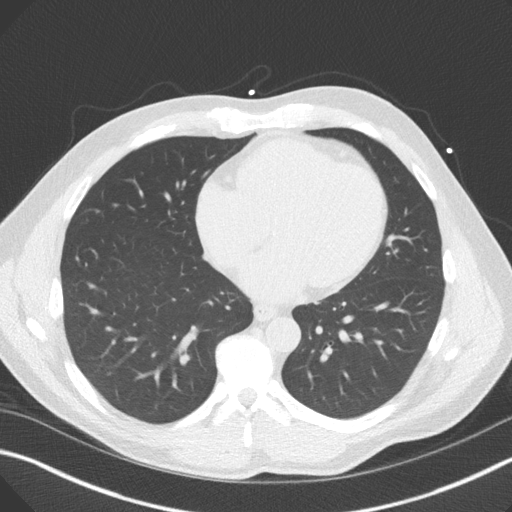
[im 26/47  lung]
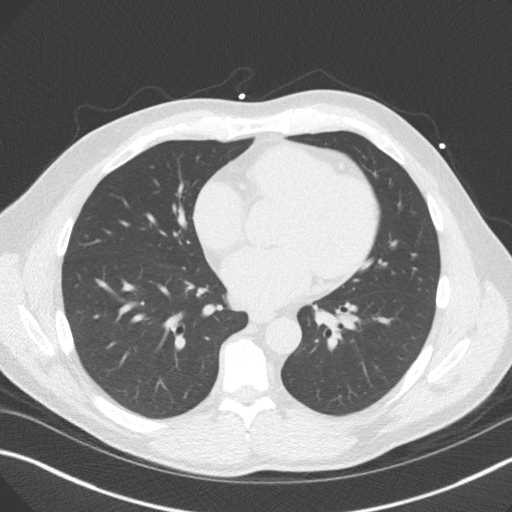
[im 31/47  lung]
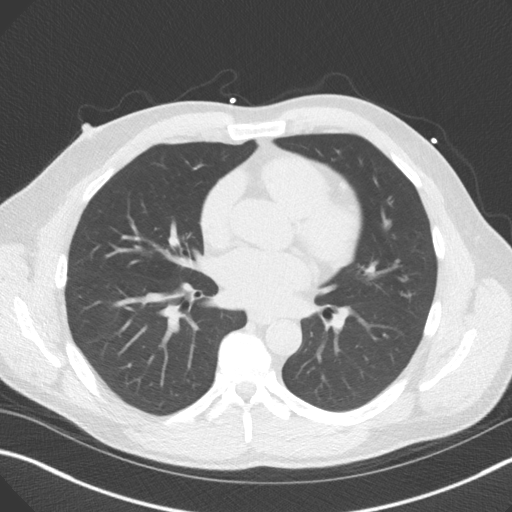
[im 36/47  lung]
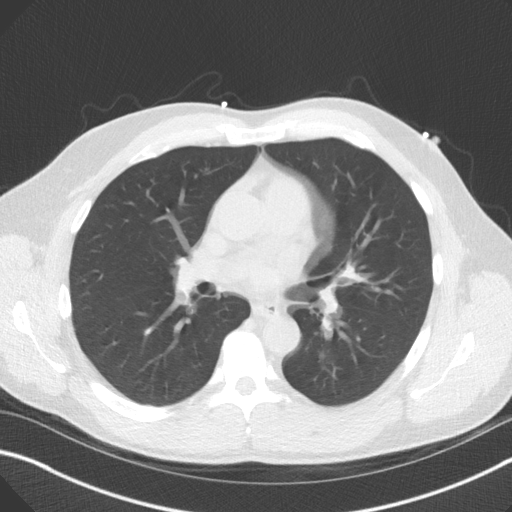
[im 41/47  lung]
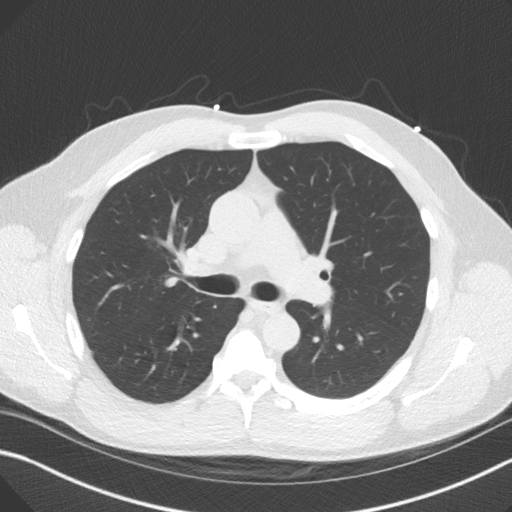

[16 of 20 positions shown; findings below may reference images not displayed]

FINDINGS: Vascular: No visible atherosclerosis involving the thoracic or upper
abdominal aorta. No evidence of aneurysm.

Mediastinum/Nodes: No pathologic lymphadenopathy within the
visualized mediastinum. Calcified lymph node adjacent to the distal
esophagus just above the EG junction. Visualized esophagus normal in
appearance.

Lungs/Pleura: Visualized lung parenchyma clear. Central bronchi
patent without significant bronchial wall thickening. No pleural
effusions.

Upper Abdomen: Unremarkable for the unenhanced technique.

Musculoskeletal: Mild lower thoracic spondylosis.
IMPRESSION: No significant extracardiac findings.
FINDINGS: Non-cardiac: See separate report from [REDACTED].

Ascending Aorta: Normal Caliber.  No calcifications.

Pericardium: Normal

Coronary arteries: Normal coronary origins. Coronary calcifications
in the mid and distal LAD.
IMPRESSION: Coronary calcium score of 42. This was 78th percentile for age and
sex matched control.

Meliton Tiger

*** End of Addendum ***
EXAM:
OVER-READ INTERPRETATION CT CHEST

The following report is an over-read performed by radiologist Dr.
over-read does not include interpretation of cardiac or coronary
anatomy or pathology. The coronary calcium score interpretation by
the cardiologist is attached.
FINDINGS: Vascular: No visible atherosclerosis involving the thoracic or upper
abdominal aorta. No evidence of aneurysm.

Mediastinum/Nodes: No pathologic lymphadenopathy within the
visualized mediastinum. Calcified lymph node adjacent to the distal
esophagus just above the EG junction. Visualized esophagus normal in
appearance.

Lungs/Pleura: Visualized lung parenchyma clear. Central bronchi
patent without significant bronchial wall thickening. No pleural
effusions.

Upper Abdomen: Unremarkable for the unenhanced technique.

Musculoskeletal: Mild lower thoracic spondylosis.
IMPRESSION: No significant extracardiac findings.

## 2021-01-11 ENCOUNTER — Other Ambulatory Visit: Payer: Self-pay | Admitting: Podiatry

## 2021-01-12 NOTE — Telephone Encounter (Signed)
Please advise 

## 2021-01-13 ENCOUNTER — Other Ambulatory Visit: Payer: Self-pay | Admitting: Internal Medicine

## 2021-01-13 ENCOUNTER — Other Ambulatory Visit: Payer: Self-pay | Admitting: Orthopedic Surgery

## 2021-01-13 DIAGNOSIS — G8929 Other chronic pain: Secondary | ICD-10-CM

## 2021-01-13 DIAGNOSIS — M5416 Radiculopathy, lumbar region: Secondary | ICD-10-CM | POA: Diagnosis not present

## 2021-01-13 DIAGNOSIS — M4317 Spondylolisthesis, lumbosacral region: Secondary | ICD-10-CM | POA: Diagnosis not present

## 2021-01-13 DIAGNOSIS — M545 Low back pain, unspecified: Secondary | ICD-10-CM

## 2021-01-16 ENCOUNTER — Telehealth: Payer: Self-pay

## 2021-01-16 NOTE — Telephone Encounter (Signed)
   [facility] Pre-operative Risk Assessment    Patient Name: Carl Kim  DOB: Jan 03, 1966 MRN: [id number]   Request for surgical clearance:  What type of surgery is being performed   Nerve Root Block  When is this surgery scheduled  TBD  What type of clearance is required Pharmacy  Are there any medications that need to be held prior to surgery and how long  Hold Plavix 5 days prior  Practice name and name of physician performing surgery  [facility]   What is the office phone number [phone]   7.   What is the office fax number          [phone]  8.   Anesthesia type   None listed   Neoma Laming 01/16/2021, 4:07 PM  _________________________________________________________________   (provider comments below)

## 2021-01-17 NOTE — Telephone Encounter (Signed)
    Patient Name: Carl Kim  DOB: 28-Oct-1965 MRN: [id number]  Primary Cardiologist: Rollene Rotunda, MD  Chart reviewed as part of pre-operative protocol coverage.   Per Dr. Antoine Poche, patient can hold plavix 5 days prior to his upcoming nerve block with plans to restart as soon as he is cleared to do so by his surgeon.  I will route this recommendation to the requesting party via Epic fax function and remove from pre-op pool.  Please call with questions.  Beatriz Stallion, PA-C 01/17/2021, 4:07 PM

## 2021-01-24 ENCOUNTER — Other Ambulatory Visit: Payer: Self-pay

## 2021-01-24 ENCOUNTER — Ambulatory Visit
Admission: RE | Admit: 2021-01-24 | Discharge: 2021-01-24 | Disposition: A | Discharge status: Home / Self Care | Payer: BC Managed Care – PPO | Source: Ambulatory Visit | Attending: Orthopedic Surgery | Admitting: Orthopedic Surgery

## 2021-01-24 DIAGNOSIS — M545 Low back pain, unspecified: Secondary | ICD-10-CM | POA: Diagnosis not present

## 2021-01-24 DIAGNOSIS — G8929 Other chronic pain: Secondary | ICD-10-CM

## 2021-01-24 MED ORDER — IOPAMIDOL (ISOVUE-M 200) INJECTION 41%
1.0000 mL | Freq: Once | INTRAMUSCULAR | Status: AC
  Administered 2021-01-24: 1 mL via EPIDURAL

## 2021-01-24 MED ORDER — METHYLPREDNISOLONE ACETATE 40 MG/ML INJ SUSP (RADIOLOG
80.0000 mg | Freq: Once | INTRAMUSCULAR | Status: AC
  Administered 2021-01-24: 80 mg via EPIDURAL

## 2021-01-24 NOTE — Discharge Instructions (Signed)

## 2021-02-13 DIAGNOSIS — M545 Low back pain, unspecified: Secondary | ICD-10-CM | POA: Diagnosis not present

## 2021-02-24 DIAGNOSIS — M5416 Radiculopathy, lumbar region: Secondary | ICD-10-CM | POA: Diagnosis not present

## 2021-02-28 ENCOUNTER — Telehealth: Payer: Self-pay | Admitting: *Deleted

## 2021-02-28 NOTE — Telephone Encounter (Signed)
   [facility] Pre-operative Risk Assessment    Patient Name: Carl Kim  DOB: 20-May-1966 MRN: [id number]  Request for surgical clearance:  What type of surgery is being performed? TLIF L5-S1  When is this surgery scheduled? TBD  What type of clearance is required (medical clearance vs. Pharmacy clearance to hold med vs. Both)? both  Are there any medications that need to be held prior to surgery and how long? Plavix and ASA  Practice name and name of physician performing surgery? [facility] Dr. Rolena Infante  What is the office phone number? [phone]   7.   What is the office fax number? [phone]  8.   Anesthesia type (None, local, MAC, general) ? General   Carl Kim Carl Kim 02/28/2021, 1:55 PM  _________________________________________________________________   (provider comments below)

## 2021-03-02 NOTE — Telephone Encounter (Signed)
LVMTCB

## 2021-03-06 NOTE — Telephone Encounter (Signed)
Primary Cardiologist:James Hochrein, MD  Chart reviewed as part of pre-operative protocol coverage. Because of Zacharias Ridling Lafosse's past medical history and time since last visit, he/she will require a follow-up visit in order to better assess preoperative cardiovascular risk.  Pre-op covering staff: - Please schedule appointment and call patient to inform them. - Please contact requesting surgeon's office via preferred method (i.e, phone, fax) to inform them of need for appointment prior to surgery.  If applicable, this message will also be routed to pharmacy pool and/or primary cardiologist for input on holding anticoagulant/antiplatelet agent as requested below so that this information is available at time of patient's appointment.   Ronney Asters, NP  03/06/2021, 3:25 PM

## 2021-03-06 NOTE — Telephone Encounter (Signed)
Pt has been scheduled to see Joni Reining, NP, 03/17/2021, clearance will be addressed at that time.  Will route back to [facility] to make them aware.

## 2021-03-16 NOTE — H&P (View-Only) (Signed)
Cardiology Office Note   Date:  03/17/2021   ID:  [name], DOB 18-Jul-1965, MRN [id number]  PCP:  Roderick Pee, PA  Cardiologist: Dr. Antoine Poche CC: Preoperative cardiac evaluation  History of Present Illness: Carl Kim is a 55 y.o. male who presents for preoperative cardiac evaluation.  He has a planned TLIF of the L5-S1 on date to be determined, he requires both pharmacy clearance and cardiac evaluation.  The patient is on aspirin and Plavix.  He is due to have the procedure by Dr. Shon Baton through [facility].  Carl Kim has a history of CAD.  He underwent a cardiac CTA which revealed calcification in the LAD and a calcium score of 42 which places him on the 78th percentile for age and sex matched controls.  As result he underwent a p.o. ETT which was abnormal revealing ST depression in the inferior lateral leads.  A subsequent cardiac catheterization was completed on 06/22/2019 which revealed sequential lesion in the mid LAD of 90% followed by 40% treated with drug-eluting stent, which jailed the diagonal 2 branch, and also had a 60% ostial stenosis.  He does have residual disease in the distal LAD and large OM1 branch.  He was placed on aspirin and Plavix for at least 6 months.  Was last seen by Dr. Antoine Poche on 08/22/2020 at which time he complained of sharp chest pain with some dyspnea on exertion.  It was not as severe as prior to his angioplasty.  He was not very active due to chronic back pain and was getting injections.  At that time he was continued on dual antiplatelet therapy, given instructions on a plant-based diet for weight loss, to increase his exercise is much as possible with his chronic back pain.  His blood pressure was well controlled along with labs to include an LDL at 59 and an HDL of 41.  He had a follow-up POET in April 2022 which was negative.  He comes today with recurrent symptoms of chest pressure and dyspnea with exertion. He states he was told to walk due to  his back pain, but has chest pressure and dyspnea with this. He states the pressure is similar to symptoms he had prior to cath one year ago, prior to the stent placement in LAD.  He states that his EKG and stress tests prior to the stent placement were all negative.     Past Medical History:  Diagnosis Date   Chest discomfort 2014   due to food poisoning/went to hospital per EMS   Hypertension     Past Surgical History:  Procedure Laterality Date   CERVICAL DISCECTOMY  2016   C4-5   CORONARY STENT INTERVENTION N/A 06/22/2019   Procedure: CORONARY STENT INTERVENTION;  Surgeon: Carl Kendall, MD;  Location: [facility];  Service: Cardiovascular;  Laterality: N/A;   LEFT HEART CATH AND CORONARY ANGIOGRAPHY N/A 06/22/2019   Procedure: LEFT HEART CATH AND CORONARY ANGIOGRAPHY;  Surgeon: Carl Kendall, MD;  Location: [facility];  Service: Cardiovascular;  Laterality: N/A;   WRIST SURGERY     Left     Current Outpatient Medications  Medication Sig Dispense Refill   aspirin EC 81 MG tablet Take 1 tablet (81 mg total) by mouth daily with breakfast. 30 tablet 1   clopidogrel (PLAVIX) 75 MG tablet Take 1 tablet (75 mg total) by mouth daily. 30 tablet 0   diclofenac (VOLTAREN) 75 MG EC tablet TAKE 1 TABLET BY MOUTH TWICE  A DAY 50 tablet 2   fluticasone (FLONASE) 50 MCG/ACT nasal spray Place 1 spray into both nostrils daily as needed for allergies or rhinitis.     gabapentin (NEURONTIN) 300 MG capsule Take 300 mg by mouth at bedtime.     Ibuprofen 200 MG CAPS Take by mouth.     lisinopril-hydrochlorothiazide (ZESTORETIC) 10-12.5 MG tablet Take 0.5 tablets by mouth daily. 90 tablet 1   metoprolol succinate (TOPROL-XL) 25 MG 24 hr tablet Take 0.5 tablets (12.5 mg total) by mouth daily. 45 tablet 7   Multiple Vitamin (MULTIVITAMIN WITH MINERALS) TABS tablet Take 2 tablets by mouth daily.     rosuvastatin (CRESTOR) 20 MG tablet Take 1 tablet (20 mg total) by mouth daily. 90  tablet 1   nitroGLYCERIN (NITROSTAT) 0.4 MG SL tablet PLACE 1 TABLET (0.4 MG TOTAL) UNDER THE TONGUE EVERY 5 (FIVE) MINUTES AS NEEDED FOR CHEST PAIN. (Patient not taking: Reported on 03/17/2021) 90 tablet 3   pantoprazole (PROTONIX) 40 MG tablet Take 1 tablet (40 mg total) by mouth daily. 30 tablet 1   No current facility-administered medications for this visit.    Allergies:   Patient has no known allergies.    Social History:  The patient  reports that he has never smoked. He has never used smokeless tobacco. He reports current alcohol use of about 10.0 - 12.0 standard drinks per week.   Family History:  The patient's family history includes Diabetes in his brother and father; Leukemia in his father.    ROS: All other systems are reviewed and negative. Unless otherwise mentioned in H&P    PHYSICAL EXAM: VS:  BP 110/60   Pulse 60   Ht 5\' 11"  (1.803 m)   Wt 219 lb 12.8 oz (99.7 kg)   SpO2 99%   BMI 30.66 kg/m  , BMI Body mass index is 30.66 kg/m. GEN: Well nourished, well developed, in no acute distress HEENT: normal Neck: no JVD, carotid bruits, or masses Cardiac: RRR; no murmurs, rubs, or gallops,no edema  Respiratory:  Clear to auscultation bilaterally, normal work of breathing GI: soft, nontender, nondistended, + BS MS: no deformity or atrophy Skin: warm and dry, no rash Neuro:  Strength and sensation are intact Psych: euthymic mood, full affect   EKG:  EKG is ordered today. The ekg ordered today demonstrates sinus bradycardia rate of 59 bpm.    Recent Labs: No results found for requested labs within last 8760 hours.    Lipid Panel    Component Value Date/Time   CHOL 91 (L) 08/17/2019 0839   TRIG 76 08/17/2019 0839   HDL 40 08/17/2019 0839   CHOLHDL 2.3 08/17/2019 0839   LDLCALC 35 08/17/2019 0839      Wt Readings from Last 3 Encounters:  03/17/21 219 lb 12.8 oz (99.7 kg)  08/22/20 224 lb 12.8 oz (102 kg)  08/20/19 195 lb (88.5 kg)      Other  studies Reviewed: POET 09/07/2020 Blood pressure demonstrated a normal response to exercise. There was no ST segment deviation noted during stress. No T wave inversion was noted during stress.   Normal ECG stress test.  LHC 06/22/2019  Conclusions: Severe single-vessel coronary artery disease with sequential 90% and 40% mid LAD stenoses involving D2, which has 60% ostial stenosis.  Mild to moderate, non-obstructive disease was also noted in the distal LAD and large OM1 branch. Normal left ventricular systolic function with mildly elevated filling pressure. Successful PCI to mid LAD using Synergy XD 2.75  x 28 mm drug-eluting stent (postdilated proximally to 3.5 mm) with 0% residual stenosis and TIMI-3 flow.  Jailed D2 branch has 60% ostial stenosis with TIMI-3 flow.   Recommendations: Dual antiplatelet therapy for at least 6 months.  The patient was loaded with prasugrel in the cath lab and should continue aspirin 81 mg daily and clopidogrel 75 mg daily after discharge. Aggressive secondary prevention. Anticipate same day discharge this evening if no post-cath complications.   Assessment and Plan:  CAD: Hx of stent to LAD one year ago and over the last 3 months has noticed recurrent chest pressure and dyspnea on exertion. He had stress tests prior intervention which were normal.  He worries that this pain is related to his CAD. I have discussed stress myoview with him vs cardiac cath. He would like to have cardiac cath for definitive evaluation of his known CAD due to these recurrent symptoms.               I have discussed this with Dr. Rennis Golden who is DOD at [facility] office who has agreed        with my desire to have a repeat cardiac cath. He will be schedule for 7:30 am with     Dr. [place] on Tuesday March 21, 2021. He will have pre-cath labs completed.   The patient understands that risks include but are not limited to stroke (1 in 1000), death (1 in 1000), kidney failure [usually  temporary] (1 in 500), bleeding (1 in 200), allergic reaction [possibly serious] (1 in 200), and agrees to proceed.    2. Hypertension: BP is well controlled on current regimen of Zestoretic. No changes.  Current medicines are reviewed at length with the patient today.  I have spent 45 minutes dedicated to the care of this patient on the date of this encounter to include pre-visit review of records, assessment, management and diagnostic testing,with shared decision making.  3. Hyperlipidemia: Continue statin therapy. Goal of LDL < 70.    4. Pre-Operative Cardiac Evaluation: Will hold of on clearance until after having cath before making recommendations to move forward with surgery.   Labs/ tests ordered today include: BMET, CBC. Cardiac Catheterization   Bettey Mare. Liborio Nixon, ANP, AACC   03/17/2021 9:44 AM    [facility] [address] Office [phone] Fax [phone]  Notice: This dictation was prepared with Dragon dictation along with smaller phrase technology. Any transcriptional errors that result from this process are unintentional and may not be corrected upon review.

## 2021-03-16 NOTE — Progress Notes (Signed)
Cardiology Office Note   Date:  03/17/2021   ID:  Carl Kim, DOB 18-Jul-1965, MRN [id number]  PCP:  Roderick Pee, PA  Cardiologist: Dr. Antoine Poche CC: Preoperative cardiac evaluation  History of Present Illness: Carl Kim is a 55 y.o. male who presents for preoperative cardiac evaluation.  He has a planned TLIF of the L5-S1 on date to be determined, he requires both pharmacy clearance and cardiac evaluation.  The patient is on aspirin and Plavix.  He is due to have the procedure by Dr. Shon Baton through [facility].  [name] has a history of CAD.  He underwent a cardiac CTA which revealed calcification in the LAD and a calcium score of 42 which places him on the 78th percentile for age and sex matched controls.  As result he underwent a p.o. ETT which was abnormal revealing ST depression in the inferior lateral leads.  A subsequent cardiac catheterization was completed on 06/22/2019 which revealed sequential lesion in the mid LAD of 90% followed by 40% treated with drug-eluting stent, which jailed the diagonal 2 branch, and also had a 60% ostial stenosis.  He does have residual disease in the distal LAD and large OM1 branch.  He was placed on aspirin and Plavix for at least 6 months.  Was last seen by Dr. Antoine Poche on 08/22/2020 at which time he complained of sharp chest pain with some dyspnea on exertion.  It was not as severe as prior to his angioplasty.  He was not very active due to chronic back pain and was getting injections.  At that time he was continued on dual antiplatelet therapy, given instructions on a plant-based diet for weight loss, to increase his exercise is much as possible with his chronic back pain.  His blood pressure was well controlled along with labs to include an LDL at 59 and an HDL of 41.  He had a follow-up POET in April 2022 which was negative.  He comes today with recurrent symptoms of chest pressure and dyspnea with exertion. He states he was told to walk due to  his back pain, but has chest pressure and dyspnea with this. He states the pressure is similar to symptoms he had prior to cath one year ago, prior to the stent placement in LAD.  He states that his EKG and stress tests prior to the stent placement were all negative.     Past Medical History:  Diagnosis Date   Chest discomfort 2014   due to food poisoning/went to hospital per EMS   Hypertension     Past Surgical History:  Procedure Laterality Date   CERVICAL DISCECTOMY  2016   C4-5   CORONARY STENT INTERVENTION N/A 06/22/2019   Procedure: CORONARY STENT INTERVENTION;  Surgeon: Yvonne Kendall, MD;  Location: [facility];  Service: Cardiovascular;  Laterality: N/A;   LEFT HEART CATH AND CORONARY ANGIOGRAPHY N/A 06/22/2019   Procedure: LEFT HEART CATH AND CORONARY ANGIOGRAPHY;  Surgeon: Yvonne Kendall, MD;  Location: [facility];  Service: Cardiovascular;  Laterality: N/A;   WRIST SURGERY     Left     Current Outpatient Medications  Medication Sig Dispense Refill   aspirin EC 81 MG tablet Take 1 tablet (81 mg total) by mouth daily with breakfast. 30 tablet 1   clopidogrel (PLAVIX) 75 MG tablet Take 1 tablet (75 mg total) by mouth daily. 30 tablet 0   diclofenac (VOLTAREN) 75 MG EC tablet TAKE 1 TABLET BY MOUTH TWICE  A DAY 50 tablet 2   fluticasone (FLONASE) 50 MCG/ACT nasal spray Place 1 spray into both nostrils daily as needed for allergies or rhinitis.     gabapentin (NEURONTIN) 300 MG capsule Take 300 mg by mouth at bedtime.     Ibuprofen 200 MG CAPS Take by mouth.     lisinopril-hydrochlorothiazide (ZESTORETIC) 10-12.5 MG tablet Take 0.5 tablets by mouth daily. 90 tablet 1   metoprolol succinate (TOPROL-XL) 25 MG 24 hr tablet Take 0.5 tablets (12.5 mg total) by mouth daily. 45 tablet 7   Multiple Vitamin (MULTIVITAMIN WITH MINERALS) TABS tablet Take 2 tablets by mouth daily.     rosuvastatin (CRESTOR) 20 MG tablet Take 1 tablet (20 mg total) by mouth daily. 90  tablet 1   nitroGLYCERIN (NITROSTAT) 0.4 MG SL tablet PLACE 1 TABLET (0.4 MG TOTAL) UNDER THE TONGUE EVERY 5 (FIVE) MINUTES AS NEEDED FOR CHEST PAIN. (Patient not taking: Reported on 03/17/2021) 90 tablet 3   pantoprazole (PROTONIX) 40 MG tablet Take 1 tablet (40 mg total) by mouth daily. 30 tablet 1   No current facility-administered medications for this visit.    Allergies:   Patient has no known allergies.    Social History:  The patient  reports that he has never smoked. He has never used smokeless tobacco. He reports current alcohol use of about 10.0 - 12.0 standard drinks per week.   Family History:  The patient's family history includes Diabetes in his brother and father; Leukemia in his father.    ROS: All other systems are reviewed and negative. Unless otherwise mentioned in H&P    PHYSICAL EXAM: VS:  BP 110/60   Pulse 60   Ht 5' 11" (1.803 m)   Wt 219 lb 12.8 oz (99.7 kg)   SpO2 99%   BMI 30.66 kg/m  , BMI Body mass index is 30.66 kg/m. GEN: Well nourished, well developed, in no acute distress HEENT: normal Neck: no JVD, carotid bruits, or masses Cardiac: RRR; no murmurs, rubs, or gallops,no edema  Respiratory:  Clear to auscultation bilaterally, normal work of breathing GI: soft, nontender, nondistended, + BS MS: no deformity or atrophy Skin: warm and dry, no rash Neuro:  Strength and sensation are intact Psych: euthymic mood, full affect   EKG:  EKG is ordered today. The ekg ordered today demonstrates sinus bradycardia rate of 59 bpm.    Recent Labs: No results found for requested labs within last 8760 hours.    Lipid Panel    Component Value Date/Time   CHOL 91 (L) 08/17/2019 0839   TRIG 76 08/17/2019 0839   HDL 40 08/17/2019 0839   CHOLHDL 2.3 08/17/2019 0839   LDLCALC 35 08/17/2019 0839      Wt Readings from Last 3 Encounters:  03/17/21 219 lb 12.8 oz (99.7 kg)  08/22/20 224 lb 12.8 oz (102 kg)  08/20/19 195 lb (88.5 kg)      Other  studies Reviewed: POET 09/07/2020 Blood pressure demonstrated a normal response to exercise. There was no ST segment deviation noted during stress. No T wave inversion was noted during stress.   Normal ECG stress test.  LHC 06/22/2019  Conclusions: Severe single-vessel coronary artery disease with sequential 90% and 40% mid LAD stenoses involving D2, which has 60% ostial stenosis.  Mild to moderate, non-obstructive disease was also noted in the distal LAD and large OM1 branch. Normal left ventricular systolic function with mildly elevated filling pressure. Successful PCI to mid LAD using Synergy XD 2.75   x 28 mm drug-eluting stent (postdilated proximally to 3.5 mm) with 0% residual stenosis and TIMI-3 flow.  Jailed D2 branch has 60% ostial stenosis with TIMI-3 flow.   Recommendations: Dual antiplatelet therapy for at least 6 months.  The patient was loaded with prasugrel in the cath lab and should continue aspirin 81 mg daily and clopidogrel 75 mg daily after discharge. Aggressive secondary prevention. Anticipate same day discharge this evening if no post-cath complications.   Assessment and Plan:  CAD: Hx of stent to LAD one year ago and over the last 3 months has noticed recurrent chest pressure and dyspnea on exertion. He had stress tests prior intervention which were normal.  He worries that this pain is related to his CAD. I have discussed stress myoview with him vs cardiac cath. He would like to have cardiac cath for definitive evaluation of his known CAD due to these recurrent symptoms.               I have discussed this with Dr. Hilty who is DOD at [facility] office who has agreed        with my desire to have a repeat cardiac cath. He will be schedule for 7:30 am with     Dr. Jordan on Tuesday March 21, 2021. He will have pre-cath labs completed.   The patient understands that risks include but are not limited to stroke (1 in 1000), death (1 in 1000), kidney failure [usually  temporary] (1 in 500), bleeding (1 in 200), allergic reaction [possibly serious] (1 in 200), and agrees to proceed.    2. Hypertension: BP is well controlled on current regimen of Zestoretic. No changes.  Current medicines are reviewed at length with the patient today.  I have spent 45 minutes dedicated to the care of this patient on the date of this encounter to include pre-visit review of records, assessment, management and diagnostic testing,with shared decision making.  3. Hyperlipidemia: Continue statin therapy. Goal of LDL < 70.    4. Pre-Operative Cardiac Evaluation: Will hold of on clearance until after having cath before making recommendations to move forward with surgery.   Labs/ tests ordered today include: BMET, CBC. Cardiac Catheterization   Beverely Suen M. Didi Ganaway DNP, ANP, AACC   03/17/2021 9:44 AM    [facility] [address] Office [phone] Fax [phone]  Notice: This dictation was prepared with Dragon dictation along with smaller phrase technology. Any transcriptional errors that result from this process are unintentional and may not be corrected upon review.  

## 2021-03-17 ENCOUNTER — Ambulatory Visit: Payer: BC Managed Care – PPO | Admitting: Adult Health

## 2021-03-17 ENCOUNTER — Encounter: Payer: Self-pay | Admitting: Adult Health

## 2021-03-17 ENCOUNTER — Other Ambulatory Visit: Payer: Self-pay

## 2021-03-17 VITALS — BP 110/60 | HR 60 | Ht 71.0 in | Wt 219.8 lb

## 2021-03-17 DIAGNOSIS — M5442 Lumbago with sciatica, left side: Secondary | ICD-10-CM

## 2021-03-17 DIAGNOSIS — I208 Other forms of angina pectoris: Secondary | ICD-10-CM

## 2021-03-17 DIAGNOSIS — I2510 Atherosclerotic heart disease of native coronary artery without angina pectoris: Secondary | ICD-10-CM

## 2021-03-17 DIAGNOSIS — I10 Essential (primary) hypertension: Secondary | ICD-10-CM

## 2021-03-17 DIAGNOSIS — E7800 Pure hypercholesterolemia, unspecified: Secondary | ICD-10-CM

## 2021-03-17 DIAGNOSIS — Z01810 Encounter for preprocedural cardiovascular examination: Secondary | ICD-10-CM

## 2021-03-17 DIAGNOSIS — Z01818 Encounter for other preprocedural examination: Secondary | ICD-10-CM

## 2021-03-17 DIAGNOSIS — R0609 Other forms of dyspnea: Secondary | ICD-10-CM

## 2021-03-17 DIAGNOSIS — G8929 Other chronic pain: Secondary | ICD-10-CM

## 2021-03-17 MED ORDER — SODIUM CHLORIDE 0.9% FLUSH: 3.0000 mL | Freq: Two times a day (BID) | INTRAVENOUS | Status: DC

## 2021-03-17 NOTE — Patient Instructions (Signed)
Medication Instructions:  No Changes *If you need a refill on your cardiac medications before your next appointment, please call your pharmacy*   Lab Work: BMET, CBC, : Today If you have labs (blood work) drawn today and your tests are completely normal, you will receive your results only by: MyChart Message (if you have MyChart) OR A paper copy in the mail If you have any lab test that is abnormal or we need to change your treatment, we will call you to review the results.   Testing/Procedures:  [facility] CARDIOVASCULAR DIVISION [facility] [address] [place] [address] [place] [address] Dept: [phone] Loc: [phone]  Carl Kim  03/17/2021  You are scheduled for a Cardiac Catheterization on Tuesday, October 18 with Dr. Peter Kim.  1. Please arrive at the [facility] (Main Entrance A) at [facility]: [address] [place], [place] [address] at 5:30 AM (This time is two hours before your procedure to ensure your preparation). Free valet parking service is available.   Special note: Every effort is made to have your procedure done on time. Please understand that emergencies sometimes delay scheduled procedures.  2. Diet: Do not eat solid foods after midnight.  The patient may have clear liquids until 5am upon the day of the procedure.  3. Labs: You will need to have blood drawn on Friday, October 14 at [facility] [address], [place]  Open: 8am - 5pm (Lunch 12:30 - 1:30)   Phone: [phone]. You do not need to be fasting.  4. Medication instructions in preparation for your procedure:   Contrast Allergy: No    Current Outpatient Medications (Cardiovascular):    lisinopril-hydrochlorothiazide (ZESTORETIC) 10-12.5 MG tablet, Take 0.5 tablets by mouth daily.   metoprolol succinate (TOPROL-XL) 25 MG 24 hr tablet, Take 0.5 tablets (12.5 mg total) by mouth daily.   rosuvastatin (CRESTOR) 20 MG  tablet, Take 1 tablet (20 mg total) by mouth daily.   nitroGLYCERIN (NITROSTAT) 0.4 MG SL tablet, PLACE 1 TABLET (0.4 MG TOTAL) UNDER THE TONGUE EVERY 5 (FIVE) MINUTES AS NEEDED FOR CHEST PAIN. (Patient not taking: Reported on 03/17/2021)  Current Outpatient Medications (Respiratory):    fluticasone (FLONASE) 50 MCG/ACT nasal spray, Place 1 spray into both nostrils daily as needed for allergies or rhinitis.  Current Outpatient Medications (Analgesics):    aspirin EC 81 MG tablet, Take 1 tablet (81 mg total) by mouth daily with breakfast.   diclofenac (VOLTAREN) 75 MG EC tablet, TAKE 1 TABLET BY MOUTH TWICE A DAY   Ibuprofen 200 MG CAPS, Take by mouth.  Current Outpatient Medications (Hematological):    clopidogrel (PLAVIX) 75 MG tablet, Take 1 tablet (75 mg total) by mouth daily.  Current Outpatient Medications (Other):    gabapentin (NEURONTIN) 300 MG capsule, Take 300 mg by mouth at bedtime.   Multiple Vitamin (MULTIVITAMIN WITH MINERALS) TABS tablet, Take 2 tablets by mouth daily.   pantoprazole (PROTONIX) 40 MG tablet, Take 1 tablet (40 mg total) by mouth daily. *For reference purposes while preparing patient instructions.   Delete this med list prior to printing instructions for patient.*          On the morning of your procedure, take your Aspirin and Plavix any morning medicines NOT listed above.  You may use sips of water.  5. Plan for one night stay--bring personal belongings. 6. Bring a current list of your medications and current insurance cards. 7. You MUST have a responsible  person to drive you home. 8. Someone MUST be with you the first 24 hours after you arrive home or your discharge will be delayed. 9. Please wear clothes that are easy to get on and off and wear slip-on shoes.  Thank you for allowing [place] to care for you!   -- [facility]    Follow-Up: At [facility], you and your health needs are our priority.  As part of  our continuing mission to provide you with exceptional heart care, we have created designated Provider Care Teams.  These Care Teams include your primary Cardiologist (physician) and Advanced Practice Providers (APPs -  Physician Assistants and Nurse Practitioners) who all work together to provide you with the care you need, when you need it.  We recommend signing up for the patient portal called "MyChart".  Sign up information is provided on this After Visit Summary.  MyChart is used to connect with patients for Virtual Visits (Telemedicine).  Patients are able to view lab/test results, encounter notes, upcoming appointments, etc.  Non-urgent messages can be sent to your provider as well.   To learn more about what you can do with MyChart, go to [email or web address].    Your next appointment:   2 week(s)  The format for your next appointment:   In Person  Provider:   Joni Reining, DNP, ANP

## 2021-03-18 LAB — BASIC METABOLIC PANEL
BUN/Creatinine Ratio: 21 — ABNORMAL HIGH (ref 9–20)
BUN: 26 mg/dL — ABNORMAL HIGH (ref 6–24)
CO2: 27 mmol/L (ref 20–29)
Calcium: 10.1 mg/dL (ref 8.7–10.2)
Chloride: 104 mmol/L (ref 96–106)
Creatinine, Ser: 1.22 mg/dL (ref 0.76–1.27)
Glucose: 74 mg/dL (ref 70–99)
Potassium: 5.2 mmol/L (ref 3.5–5.2)
Sodium: 144 mmol/L (ref 134–144)
eGFR: 71 mL/min/{1.73_m2} (ref >59)

## 2021-03-18 LAB — CBC
Hematocrit: 44.1 % (ref 37.5–51.0)
Hemoglobin: 15.6 g/dL (ref 13.0–17.7)
MCH: 31.3 pg (ref 26.6–33.0)
MCHC: 35.4 g/dL (ref 31.5–35.7)
MCV: 89 fL (ref 79–97)
Platelets: 197 10*3/uL (ref 150–450)
RBC: 4.98 x10E6/uL (ref 4.14–5.80)
RDW: 12.4 % (ref 11.6–15.4)
WBC: 7.2 10*3/uL (ref 3.4–10.8)

## 2021-03-20 ENCOUNTER — Telehealth: Payer: Self-pay | Admitting: *Deleted

## 2021-03-20 ENCOUNTER — Telehealth: Payer: Self-pay

## 2021-03-20 NOTE — Telephone Encounter (Addendum)
Cardiac catheterization scheduled at [facility] for: Tuesday March 21, 2021 7:30 AM [facility] Main Entrance A [facility]) at: 5:30 AM   No solid food after midnight prior to cath, clear liquids until 5 AM day of procedure.  Medication instructions: Hold: Lisinopril-HCT-AM of procedure  Except hold medications usual morning medications can be taken pre-cath with sips of water including: aspirin 81 mg Plavix 75 mg    Confirmed patient has responsible adult to drive home post procedure and be with patient first 24 hours after arriving home.  [facility] does allow one visitor to accompany you and wait in the hospital waiting room while you are there for your procedure. You and your visitor will be asked to wear a mask once you enter the hospital.   Patient reports does not currently have any symptoms concerning for COVID-19 and no household members with COVID-19 like illness.                         Reviewed procedure/mask/visitor instructions with patient.

## 2021-03-20 NOTE — Telephone Encounter (Addendum)
Called patient regarding blood test results.----- Message from Jodelle Gross, NP sent at 03/18/2021  9:30 AM EDT ----- Reviewed precath labs. Creatinine slightly elevated. May need extra fluids pre and post cath.   Samara Deist

## 2021-03-21 ENCOUNTER — Ambulatory Visit (HOSPITAL_COMMUNITY)
Admission: RE | Admit: 2021-03-21 | Discharge: 2021-03-21 | Disposition: A | Discharge status: Home / Self Care | Payer: BC Managed Care – PPO | Attending: Cardiology | Admitting: Cardiology

## 2021-03-21 ENCOUNTER — Encounter (HOSPITAL_COMMUNITY)
Admission: RE | Disposition: A | Discharge status: Home / Self Care | Payer: Self-pay | Source: Home / Self Care | Attending: Cardiology

## 2021-03-21 ENCOUNTER — Other Ambulatory Visit: Payer: Self-pay

## 2021-03-21 ENCOUNTER — Encounter (HOSPITAL_COMMUNITY): Payer: Self-pay | Admitting: Cardiology

## 2021-03-21 DIAGNOSIS — R0789 Other chest pain: Secondary | ICD-10-CM | POA: Insufficient documentation

## 2021-03-21 DIAGNOSIS — Z79899 Other long term (current) drug therapy: Secondary | ICD-10-CM | POA: Diagnosis not present

## 2021-03-21 DIAGNOSIS — Z7982 Long term (current) use of aspirin: Secondary | ICD-10-CM | POA: Diagnosis not present

## 2021-03-21 DIAGNOSIS — I2510 Atherosclerotic heart disease of native coronary artery without angina pectoris: Secondary | ICD-10-CM | POA: Diagnosis not present

## 2021-03-21 DIAGNOSIS — Z7902 Long term (current) use of antithrombotics/antiplatelets: Secondary | ICD-10-CM | POA: Insufficient documentation

## 2021-03-21 DIAGNOSIS — R0609 Other forms of dyspnea: Secondary | ICD-10-CM | POA: Diagnosis not present

## 2021-03-21 DIAGNOSIS — Z955 Presence of coronary angioplasty implant and graft: Secondary | ICD-10-CM | POA: Diagnosis not present

## 2021-03-21 DIAGNOSIS — I10 Essential (primary) hypertension: Secondary | ICD-10-CM | POA: Insufficient documentation

## 2021-03-21 DIAGNOSIS — E785 Hyperlipidemia, unspecified: Secondary | ICD-10-CM | POA: Diagnosis not present

## 2021-03-21 DIAGNOSIS — I25119 Atherosclerotic heart disease of native coronary artery with unspecified angina pectoris: Secondary | ICD-10-CM | POA: Diagnosis present

## 2021-03-21 HISTORY — PX: LEFT HEART CATH AND CORONARY ANGIOGRAPHY: CATH118249

## 2021-03-21 SURGERY — LEFT HEART CATH AND CORONARY ANGIOGRAPHY
Anesthesia: LOCAL

## 2021-03-21 MED ORDER — HEPARIN (PORCINE) IN NACL 1000-0.9 UT/500ML-% IV SOLN
INTRAVENOUS | Status: AC
  Filled 2021-03-21: qty 1000

## 2021-03-21 MED ORDER — SODIUM CHLORIDE 0.9% FLUSH: 3.0000 mL | Freq: Two times a day (BID) | INTRAVENOUS | Status: DC

## 2021-03-21 MED ORDER — SODIUM CHLORIDE 0.9 % IV SOLN: 250.0000 mL | INTRAVENOUS | Status: DC | PRN

## 2021-03-21 MED ORDER — VERAPAMIL HCL 2.5 MG/ML IV SOLN
INTRAVENOUS | Status: AC
  Filled 2021-03-21: qty 2

## 2021-03-21 MED ORDER — SODIUM CHLORIDE 0.9 % WEIGHT BASED INFUSION
3.0000 mL/kg/h | INTRAVENOUS | Status: AC
  Administered 2021-03-21: 3 mL/kg/h via INTRAVENOUS

## 2021-03-21 MED ORDER — FENTANYL CITRATE (PF) 100 MCG/2ML IJ SOLN
INTRAMUSCULAR | Status: DC | PRN
  Administered 2021-03-21: 25 ug via INTRAVENOUS

## 2021-03-21 MED ORDER — LIDOCAINE HCL (PF) 1 % IJ SOLN
INTRAMUSCULAR | Status: DC | PRN
  Administered 2021-03-21: 2 mL

## 2021-03-21 MED ORDER — HEPARIN SODIUM (PORCINE) 1000 UNIT/ML IJ SOLN
INTRAMUSCULAR | Status: DC | PRN
  Administered 2021-03-21: 5000 [IU] via INTRAVENOUS

## 2021-03-21 MED ORDER — FENTANYL CITRATE (PF) 100 MCG/2ML IJ SOLN
INTRAMUSCULAR | Status: AC
  Filled 2021-03-21: qty 2

## 2021-03-21 MED ORDER — IOHEXOL 350 MG/ML SOLN
INTRAVENOUS | Status: DC | PRN
  Administered 2021-03-21: 55 mL

## 2021-03-21 MED ORDER — SODIUM CHLORIDE 0.9 % WEIGHT BASED INFUSION: 1.0000 mL/kg/h | INTRAVENOUS | Status: AC

## 2021-03-21 MED ORDER — LIDOCAINE HCL (PF) 1 % IJ SOLN
INTRAMUSCULAR | Status: AC
  Filled 2021-03-21: qty 30

## 2021-03-21 MED ORDER — SODIUM CHLORIDE 0.9 % WEIGHT BASED INFUSION: 1.0000 mL/kg/h | INTRAVENOUS | Status: DC

## 2021-03-21 MED ORDER — ACETAMINOPHEN 325 MG PO TABS: 650.0000 mg | ORAL_TABLET | ORAL | Status: DC | PRN

## 2021-03-21 MED ORDER — ASPIRIN 81 MG PO CHEW: 81.0000 mg | CHEWABLE_TABLET | ORAL | Status: DC

## 2021-03-21 MED ORDER — HEPARIN (PORCINE) IN NACL 1000-0.9 UT/500ML-% IV SOLN
INTRAVENOUS | Status: DC | PRN
  Administered 2021-03-21 (×2): 500 mL

## 2021-03-21 MED ORDER — ONDANSETRON HCL 4 MG/2ML IJ SOLN: 4.0000 mg | Freq: Four times a day (QID) | INTRAMUSCULAR | Status: DC | PRN

## 2021-03-21 MED ORDER — MIDAZOLAM HCL 2 MG/2ML IJ SOLN
INTRAMUSCULAR | Status: DC | PRN
  Administered 2021-03-21: 2 mg via INTRAVENOUS

## 2021-03-21 MED ORDER — MIDAZOLAM HCL 2 MG/2ML IJ SOLN
INTRAMUSCULAR | Status: AC
  Filled 2021-03-21: qty 2

## 2021-03-21 MED ORDER — HEPARIN SODIUM (PORCINE) 1000 UNIT/ML IJ SOLN
INTRAMUSCULAR | Status: AC
  Filled 2021-03-21: qty 1

## 2021-03-21 MED ORDER — VERAPAMIL HCL 2.5 MG/ML IV SOLN
INTRAVENOUS | Status: DC | PRN
  Administered 2021-03-21: 10 mL via INTRA_ARTERIAL

## 2021-03-21 MED ORDER — SODIUM CHLORIDE 0.9% FLUSH: 3.0000 mL | INTRAVENOUS | Status: DC | PRN

## 2021-03-21 SURGICAL SUPPLY — 10 items
CATH 5FR JL3.5 JR4 ANG PIG MP (CATHETERS) ×1 IMPLANT
DEVICE RAD COMP TR BAND LRG (VASCULAR PRODUCTS) ×1 IMPLANT
GLIDESHEATH SLEND SS 6F .021 (SHEATH) ×1 IMPLANT
GUIDEWIRE INQWIRE 1.5J.035X260 (WIRE) IMPLANT
INQWIRE 1.5J .035X260CM (WIRE) ×2
KIT HEART LEFT (KITS) ×2 IMPLANT
PACK CARDIAC CATHETERIZATION (CUSTOM PROCEDURE TRAY) ×2 IMPLANT
SHEATH PROBE COVER 6X72 (BAG) ×1 IMPLANT
TRANSDUCER W/STOPCOCK (MISCELLANEOUS) ×2 IMPLANT
TUBING CIL FLEX 10 FLL-RA (TUBING) ×2 IMPLANT

## 2021-03-21 NOTE — Progress Notes (Signed)
Pt noted to have level 1 hematoma around TR band site. Manual pressure held at the site for 15 minutes. Will reassess and attempt air removal at 1030.

## 2021-03-21 NOTE — Progress Notes (Signed)
Pt ambulated without difficulty or bleeding. Pt's radial site was level 0 at time of DC and RN had no difficulty removing TRB after manual pressure. Discharged home with his wife who will drive and stay with pt x 24 hrs.

## 2021-03-21 NOTE — Interval H&P Note (Signed)
History and Physical Interval Note:  03/21/2021 7:27 AM  Carl Kim  has presented today for surgery, with the diagnosis of chest pain.  The various methods of treatment have been discussed with the patient and family. After consideration of risks, benefits and other options for treatment, the patient has consented to  Procedure(s): LEFT HEART CATH AND CORONARY ANGIOGRAPHY (N/A) as a surgical intervention.  The patient's history has been reviewed, patient examined, no change in status, stable for surgery.  I have reviewed the patient's chart and labs.  Questions were answered to the patient's satisfaction.    Cath Lab Visit (complete for each Cath Lab visit)  Clinical Evaluation Leading to the Procedure:   ACS: No.  Non-ACS:    Anginal Classification: CCS II  Anti-ischemic medical therapy: Minimal Therapy (1 class of medications)  Non-Invasive Test Results: No non-invasive testing performed  Prior CABG: No previous CABG       [facility] 7:27 AM 03/21/2021

## 2021-03-21 NOTE — Discharge Instructions (Signed)
Radial Site Care  This sheet gives you information about how to care for yourself after your procedure. Your health care provider may also give you more specific instructions. If you have problems or questions, contact your health care provider. What can I expect after the procedure? After the procedure, it is common to have: Bruising and tenderness at the catheter insertion area. Follow these instructions at home: Medicines Take over-the-counter and prescription medicines only as told by your health care provider. Insertion site care Follow instructions from your health care provider about how to take care of your insertion site. Make sure you: Wash your hands with soap and water before you remove your bandage (dressing). If soap and water are not available, use hand sanitizer. May remove dressing in 24 hours. Check your insertion site every day for signs of infection. Check for: Redness, swelling, or pain. Fluid or blood. Pus or a bad smell. Warmth. Do no take baths, swim, or use a hot tub for 5 days. You may shower 24-48 hours after the procedure. Remove the dressing and gently wash the site with plain soap and water. Pat the area dry with a clean towel. Do not rub the site. That could cause bleeding. Do not apply powder or lotion to the site. Activity  For 24 hours after the procedure, or as directed by your health care provider: Do not flex or bend the affected arm. Do not push or pull heavy objects with the affected arm. Do not drive yourself home from the hospital or clinic. You may drive 24 hours after the procedure. Do not operate machinery or power tools. KEEP ARM ELEVATED THE REMAINDER OF THE DAY. Do not push, pull or lift anything that is heavier than 10 lb for 5 days. Ask your health care provider when it is okay to: Return to work or school. Resume usual physical activities or sports. Resume sexual activity. General instructions If the catheter site starts to  bleed, raise your arm and put firm pressure on the site. If the bleeding does not stop, get help right away. This is a medical emergency. DRINK PLENTY OF FLUIDS FOR THE NEXT 2-3 DAYS. No alcohol consumption for 24 hours after receiving sedation. If you went home on the same day as your procedure, a responsible adult should be with you for the first 24 hours after you arrive home. Keep all follow-up visits as told by your health care provider. This is important. Contact a health care provider if: You have a fever. You have redness, swelling, or yellow drainage around your insertion site. Get help right away if: You have unusual pain at the radial site. The catheter insertion area swells very fast. The insertion area is bleeding, and the bleeding does not stop when you hold steady pressure on the area. Your arm or hand becomes pale, cool, tingly, or numb. These symptoms may represent a serious problem that is an emergency. Do not wait to see if the symptoms will go away. Get medical help right away. Call your local emergency services (911 in the [place] Do not drive yourself to the hospital. Summary After the procedure, it is common to have bruising and tenderness at the site. Follow instructions from your health care provider about how to take care of your radial site wound. Check the wound every day for signs of infection.  This information is not intended to replace advice given to you by your health care provider. Make sure you discuss any questions you have with   your health care provider. Document Revised: 06/26/2017 Document Reviewed: 06/26/2017 Elsevier Patient Education  2020 [employer]  

## 2021-03-24 NOTE — Progress Notes (Deleted)
Cardiology Office Note   Date:  03/24/2021   ID:  Carl Kim, DOB [birth year], MRN [id number]  PCP:  Carl Pee, PA  Cardiologist:   CC: Post Cath Discussion with Pre-op Clearance     History of Present Illness: Carl Kim is a 55 y.o. male who presents for post cath discussion after having recurrent angina symptoms on pre-operative evaluation for TL IF of the L5-S1 on date to be determined. This was to be completed by Carl Kim through [facility]. He was to have recommendations about stopping ASA and Plavix prior to surgery.   [name] has a history of CAD.  He underwent a cardiac CTA which revealed calcification in the LAD and a calcium score of 42 which places him on the 78th percentile for age and sex matched controls.  As result he underwent a p.o. ETT which was abnormal revealing ST depression in the inferior lateral leads.  A subsequent cardiac catheterization was completed on 06/22/2019 which revealed sequential lesion in the mid LAD of 90% followed by 40% treated with drug-eluting stent, which jailed the diagonal 2 branch, and also had a 60% ostial stenosis.  He does have residual disease in the distal LAD and large OM1 branch.  He was placed on aspirin and Plavix for at least 6 months.  Due to complaints of DOE and left sided chest pain similar to pain he had prior to PCI he was sent for cardiac cath to evaluate for instent stenosis. Cath completed by Dr. Swaziland on 03/21/21 which found non-obstructive CAD with prior LAD stent, normal LV function, and mildly elevated LVEDP.    Past Medical History:  Diagnosis Date   Chest discomfort 2014   due to food poisoning/went to hospital per EMS   Hypertension     Past Surgical History:  Procedure Laterality Date   CERVICAL DISCECTOMY  2016   C4-5   CORONARY STENT INTERVENTION N/A 06/22/2019   Procedure: CORONARY STENT INTERVENTION;  Surgeon: Carl Kendall, MD;  Location: [facility];  Service: Cardiovascular;   Laterality: N/A;   LEFT HEART CATH AND CORONARY ANGIOGRAPHY N/A 06/22/2019   Procedure: LEFT HEART CATH AND CORONARY ANGIOGRAPHY;  Surgeon: Carl Kendall, MD;  Location: [facility];  Service: Cardiovascular;  Laterality: N/A;   LEFT HEART CATH AND CORONARY ANGIOGRAPHY N/A 03/21/2021   Procedure: LEFT HEART CATH AND CORONARY ANGIOGRAPHY;  Surgeon: Swaziland, Peter M, MD;  Location: [facility] INVASIVE CV LAB;  Service: Cardiovascular;  Laterality: N/A;   WRIST SURGERY     Left     Current Outpatient Medications  Medication Sig Dispense Refill   aspirin EC 81 MG tablet Take 1 tablet (81 mg total) by mouth daily with breakfast. 30 tablet 1   diclofenac (VOLTAREN) 75 MG EC tablet TAKE 1 TABLET BY MOUTH TWICE A DAY 50 tablet 2   fluticasone (FLONASE) 50 MCG/ACT nasal spray Place 1 spray into both nostrils daily as needed for allergies or rhinitis.     gabapentin (NEURONTIN) 300 MG capsule Take 300-600 mg by mouth See admin instructions. Take 300mg  in the AM and 600mg  in the PM     Ibuprofen 200 MG CAPS Take 800 mg by mouth every 6 (six) hours as needed (mild pain).     lisinopril-hydrochlorothiazide (ZESTORETIC) 10-12.5 MG tablet Take 0.5 tablets by mouth daily. (Patient taking differently: Take 1 tablet by mouth daily.) 90 tablet 1   metoprolol succinate (TOPROL-XL) 25 MG 24 hr tablet Take 0.5 tablets (12.5  mg total) by mouth daily. 45 tablet 7   Multiple Vitamin (MULTIVITAMIN WITH MINERALS) TABS tablet Take 1 tablet by mouth daily.     nitroGLYCERIN (NITROSTAT) 0.4 MG SL tablet PLACE 1 TABLET (0.4 MG TOTAL) UNDER THE TONGUE EVERY 5 (FIVE) MINUTES AS NEEDED FOR CHEST PAIN. 90 tablet 3   rosuvastatin (CRESTOR) 20 MG tablet Take 1 tablet (20 mg total) by mouth daily. 90 tablet 1   No current facility-administered medications for this visit.    Allergies:   Patient has no known allergies.    Social History:  The patient  reports that he has never smoked. He has never used smokeless tobacco. He  reports current alcohol use of about 10.0 - 12.0 standard drinks per week.   Family History:  The patient's family history includes Diabetes in his brother and father; Leukemia in his father.    ROS: All other systems are reviewed and negative. Unless otherwise mentioned in H&P    PHYSICAL EXAM: VS:  There were no vitals taken for this visit. , BMI There is no height or weight on file to calculate BMI. GEN: Well nourished, well developed, in no acute distress HEENT: normal Neck: no JVD, carotid bruits, or masses Cardiac: ***RRR; no murmurs, rubs, or gallops,no edema  Respiratory:  Clear to auscultation bilaterally, normal work of breathing GI: soft, nontender, nondistended, + BS MS: no deformity or atrophy Skin: warm and dry, no rash Neuro:  Strength and sensation are intact Psych: euthymic mood, full affect   EKG:  EKG {ACTION; IS/IS [id number]} ordered today. The ekg ordered today demonstrates ***   Recent Labs: 03/17/2021: BUN 26; Creatinine, Ser 1.22; Hemoglobin 15.6; Platelets 197; Potassium 5.2; Sodium 144    Lipid Panel    Component Value Date/Time   CHOL 91 (L) 08/17/2019 0839   TRIG 76 08/17/2019 0839   HDL 40 08/17/2019 0839   CHOLHDL 2.3 08/17/2019 0839   LDLCALC 35 08/17/2019 0839      Wt Readings from Last 3 Encounters:  03/21/21 215 lb (97.5 kg)  03/17/21 219 lb 12.8 oz (99.7 kg)  08/22/20 224 lb 12.8 oz (102 kg)      Other studies Reviewed: LHC 03/21/2021 Dist LAD lesion is 40% stenosed.   1st Mrg lesion is 20% stenosed.   2nd Diag lesion is 35% stenosed.   Non-stenotic Mid LAD-2 lesion was previously treated.   Non-stenotic Mid LAD-1 lesion was previously treated.   The left ventricular systolic function is normal.   LV end diastolic pressure is mildly elevated.   The left ventricular ejection fraction is 55-65% by visual estimate.   Nonobstructive CAD. Prior LAD stent is widely patent.  Normal LV function Mildly elevated LVEDP   Plan:  continue medical therapy. He may discontinue Plavix at this point and he is cleared to have back surgery.  ASSESSMENT AND PLAN:  1.  ***   Current medicines are reviewed at length with the patient today.  I have spent *** dedicated to the care of this patient on the date of this encounter to include pre-visit review of records, assessment, management and diagnostic testing,with shared decision making.  Labs/ tests ordered today include: *** Bettey Mare. Liborio Nixon, ANP, AACC   03/24/2021 9:21 AM    [facility] [address] Office [phone] Fax [phone]  Notice: This dictation was prepared with Dragon dictation along with smaller phrase technology. Any transcriptional errors that result from this process are unintentional and may  not be corrected upon review.

## 2021-03-31 ENCOUNTER — Ambulatory Visit: Payer: BC Managed Care – PPO | Admitting: Adult Health

## 2021-04-03 ENCOUNTER — Telehealth: Payer: Self-pay

## 2021-04-03 NOTE — Telephone Encounter (Signed)
   [facility] Pre-operative Risk Assessment    Patient Name: Carl Kim  DOB: January 09, 1966 MRN: [id number]    Request for surgical clearance:  What type of surgery is being performed TLIF L5-S1  When is this surgery scheduled TBD  What type of clearance is required  Both  Are there any medications that need to be held prior to surgery and how long  Aspirin  Practice name and name of physician performing surgery   [facility]   Dr.Dahari Shon Baton  What is the office phone number  [phone]   7.   What is the office fax number   [phone]  8.   Anesthesia type    General    Neoma Laming 04/03/2021, 4:37 PM  _________________________________________________________________   (provider comments below)

## 2021-04-04 ENCOUNTER — Other Ambulatory Visit: Payer: Self-pay | Admitting: Podiatry

## 2021-04-04 NOTE — Telephone Encounter (Signed)
Dr. Antoine Poche,  Dr. Swaziland recently recently cleared this pt for back surgery following a heart cath with nonobstructive disease and patent stent. Plavix has been stopped, but we are asked to hold ASA. Can you please provide recommendations?

## 2021-04-04 NOTE — Telephone Encounter (Signed)
Please advise 

## 2021-04-05 NOTE — Telephone Encounter (Signed)
   Name: Carl Kim  DOB: 1966/02/12  MRN: [id number]   Primary Cardiologist: Rollene Rotunda, MD  Chart reviewed as part of pre-operative protocol coverage. Patient was contacted 04/05/2021 in reference to pre-operative risk assessment for pending surgery as outlined below.  Carl Kim was last seen on 03/17/21 by Joni Reining NP.  Since that day, he underwent repeat angiography which showed patent LAD stent and nonobstructive disease. He was cleared for surgery. We were also asked to hold ASA. Given his CAD and prior stenting, we prefer to continue ASA throughout the perioperative period. If doing do will significant increase morbidity and mortality, may hold 5-7 days if necessary.   Therefore, based on ACC/AHA guidelines, the patient would be at acceptable risk for the planned procedure without further cardiovascular testing.   The patient was advised that if he develops new symptoms prior to surgery to contact our office to arrange for a follow-up visit, and he verbalized understanding.  I will route this recommendation to the requesting party via Epic fax function and remove from pre-op pool. Please call with questions.  Roe Rutherford Julies Carmickle, PA 04/05/2021, 10:25 AM

## 2021-04-05 NOTE — Telephone Encounter (Signed)
Has been 6 months since I have seen him. Should come in for appointment

## 2021-04-10 ENCOUNTER — Ambulatory Visit: Payer: Self-pay | Admitting: Orthopedic Surgery

## 2021-04-10 DIAGNOSIS — Z01818 Encounter for other preprocedural examination: Secondary | ICD-10-CM

## 2021-04-19 NOTE — Telephone Encounter (Signed)
Please make him appointment to be seen

## 2021-04-20 NOTE — Telephone Encounter (Signed)
thanks

## 2021-04-21 ENCOUNTER — Ambulatory Visit: Payer: Self-pay | Admitting: Orthopedic Surgery

## 2021-04-21 NOTE — H&P (Deleted)
  The note originally documented on this encounter has been moved the the encounter in which it belongs.  

## 2021-04-21 NOTE — H&P (Signed)
Subjective:   PMH: cardiac issues (stents), on ASA, cleared to hold 5-7 days CC: LBP, left leg pain and weakness. TLIF L5-S1 Despite physical therapy, injection therapy, activity modification, and medications his quality-of-life his continue to deteriorate. He would like to move forward with physical therapy.  Patient Active Problem List   Diagnosis Date Noted   Coronary artery disease involving native coronary artery of native heart with angina pectoris (HCC) 08/16/2019   Chest pain 07/30/2019   Palpitations 07/02/2019   Accelerating angina (HCC) 06/22/2019   Abnormal stress test 06/22/2019   S/P angioplasty with stent 06/22/2019   Chest pain of uncertain etiology 05/06/2019   Educated about COVID-19 virus infection 05/06/2019   Hyperlipidemia 02/21/2011   Hypertension    Chest discomfort    Past Medical History:  Diagnosis Date   Chest discomfort 2014   due to food poisoning/went to hospital per EMS   Hypertension     Past Surgical History:  Procedure Laterality Date   CERVICAL DISCECTOMY  2016   C4-5   CORONARY STENT INTERVENTION N/A 06/22/2019   Procedure: CORONARY STENT INTERVENTION;  Surgeon: Yvonne Kendall, MD;  Location: [facility] INVASIVE CV LAB;  Service: Cardiovascular;  Laterality: N/A;   LEFT HEART CATH AND CORONARY ANGIOGRAPHY N/A 06/22/2019   Procedure: LEFT HEART CATH AND CORONARY ANGIOGRAPHY;  Surgeon: Yvonne Kendall, MD;  Location: [facility];  Service: Cardiovascular;  Laterality: N/A;   LEFT HEART CATH AND CORONARY ANGIOGRAPHY N/A 03/21/2021   Procedure: LEFT HEART CATH AND CORONARY ANGIOGRAPHY;  Surgeon: Swaziland, Peter M, MD;  Location: [facility] INVASIVE CV LAB;  Service: Cardiovascular;  Laterality: N/A;   WRIST SURGERY     Left    Current Outpatient Medications  Medication Sig Dispense Refill Last Dose   aspirin EC 81 MG tablet Take 1 tablet (81 mg total) by mouth daily with breakfast. 30 tablet 1    diclofenac (VOLTAREN) 75 MG EC tablet TAKE 1 TABLET BY  MOUTH TWICE A DAY 50 tablet 2    fluticasone (FLONASE) 50 MCG/ACT nasal spray Place 1 spray into both nostrils daily as needed for allergies or rhinitis.      gabapentin (NEURONTIN) 300 MG capsule Take 300-600 mg by mouth See admin instructions. Take 300mg  in the AM and 600mg  in the PM      Ibuprofen 200 MG CAPS Take 800 mg by mouth every 6 (six) hours as needed (mild pain).      lisinopril-hydrochlorothiazide (ZESTORETIC) 10-12.5 MG tablet Take 0.5 tablets by mouth daily. (Patient taking differently: Take 1 tablet by mouth daily.) 90 tablet 1    metoprolol succinate (TOPROL-XL) 25 MG 24 hr tablet Take 0.5 tablets (12.5 mg total) by mouth daily. 45 tablet 7    Multiple Vitamin (MULTIVITAMIN WITH MINERALS) TABS tablet Take 1 tablet by mouth daily.      nitroGLYCERIN (NITROSTAT) 0.4 MG SL tablet PLACE 1 TABLET (0.4 MG TOTAL) UNDER THE TONGUE EVERY 5 (FIVE) MINUTES AS NEEDED FOR CHEST PAIN. 90 tablet 3    rosuvastatin (CRESTOR) 20 MG tablet Take 1 tablet (20 mg total) by mouth daily. 90 tablet 1    No current facility-administered medications for this visit.   No Known Allergies  Social History   Tobacco Use   Smoking status: Never   Smokeless tobacco: Never  Substance Use Topics   Alcohol use: Yes    Alcohol/week: 10.0 - 12.0 standard drinks    Types: 10 - 12 Cans of beer per week  Comment: occ    Family History  Problem Relation Age of Onset   Diabetes Father    Leukemia Father    Diabetes Brother    Colon cancer Neg Hx    Stomach cancer Neg Hx    Rectal cancer Neg Hx     Review of Systems Pertinent items are noted in HPI.  Objective:   Vitals: Ht: 5 ft 10 in 04/21/2021 10:50 am BP: 142/108 04/21/2021 10:50 am 138/100 04/21/2021 11:14 am  Clinical exam: Patient is alert and oriented 3. No shortness of breath, chest pain. Heart: RRR, no rubs, murmers, gallops Lungs: CTAB Abdomen: Soft and nontender. No loss of bowel and bladder control, no rebound tenderness.  Bsx4 Lumbar spine: moderate back pain especially with extension or rotation of the spine. No SI joint pain. Pain does radiate probably into the left lower extremity. No hip, knee, ankle pain with isolated joint range of motion. Neuro: 4/5 left EHL/tibialis anterior strength. Negative Babinski test, no clonus. Remainder of the motor exam in the lower extremity is 5/5.  Lumbar MRI: completed on 02/13/21 was reviewed with the patient. It was completed at [facility]; I have independently reviewed the images as well as the radiology report. Grade 1 degenerative anterolisthesis L5 on S1 with bilateral pars defects. Moderate to severe foraminal narrowing left worse than the right. Positive impingement specifically on the left L5 nerve root. Findings have not significantly changed from prior exam. Mild disc degeneration L3-5.  Assessment:   Carl Kim returns today for follow-up. At this point time he is expressed a desire to move forward with surgery. Despite physical therapy, injection therapy, activity modification, and medications his quality-of-life his continue to deteriorate. He continues to have persistent neurological deficit with both motor and sensory findings. He has positive structural changes with an isthmic spondylolisthesis causing severe foraminal stenosis affecting the exiting left L5 nerve root.  Given the failure of conservative management and his ongoing deterioration in quality-of-life he is elected to move forward with surgery. We have gone over the surgical procedure which would be a L5-S1 TLIF. This would allow for direct decompression of the L5 nerve root to address his significant radicular leg pain. Because of the pre-existing spondylolisthesis in order to prevent further worsening of his structural instability we would then supplement this with interbody and pedicle screw fixation.   Plan:   Risks and benefits of spinal fusion: Infection, bleeding, death, stroke, paralysis, ongoing  or worse pain, need for additional surgery, nonunion, leak of spinal fluid, adjacent segment degeneration requiring additional fusion surgery, Injury to abdominal vessels that can require anterior surgery to stop bleeding. Malposition of the cage and/or pedicle screws that could require additional surgery. Loss of bowel and bladder control. Postoperative hematoma causing neurologic compression that could require urgent or emergent re-operation.  The patient is expressed an understanding of the risks, benefits, and alternatives to surgery and has stated he would like to proceed. All of his questions were encouraged and addressed.  Routine preoperative medical clearance from the patient's primary care provider and cardiologist. He is cleared to hold his aspirin 5-7 days prior to surgery. I did review his medications. He is also on diclofenac which she will stop 7 days prior to surgery. He will also stop his multivitamin 7 days prior to surgery.  He has LSO brace already. He is preop [facility] can schedule.  We have also discussed the post-operative recovery period to include: bathing/showering restrictions, wound healing, activity (and driving) restrictions, medications/pain mangement.  We have also discussed post-operative redflags to include: signs and symptoms of postoperative infection, DVT/PE.  A copy of discharge instructions were given to the patient today. These were reviewed with him.  All questions invited and answered  Follow-up: 2 weeks postop

## 2021-05-02 NOTE — Progress Notes (Signed)
Surgical Instructions    Your procedure is scheduled on Thursday, December 1st.  Report to [facility] Main Entrance "A" at 5:30 A.M., then check in with the Admitting office.  Call this number if you have problems the morning of surgery:  [phone]   If you have any questions prior to your surgery date call [phone]: Open Monday-Friday 8am-4pm    Remember:  Do not eat after midnight the night before your surgery  You may drink clear liquids until 4:30 AM the morning of your surgery.   Clear liquids allowed are: Water, Non-Citrus Juices (without pulp), Carbonated Beverages, Clear Tea, Black Coffee ONLY (NO MILK, CREAM OR POWDERED CREAMER of any kind), and Gatorade    Take these medicines the morning of surgery with A SIP OF WATER Metoprolol Rosuvastatin (Crestor)  If needed: Tylenol Flonase Nasal Spray Claritin Nitroglycerin  Follow your surgeon's instructions on when to stop Aspirin.  If no instructions were given by your surgeon then you will need to call the office to get those instructions.     As of today, STOP taking any Aleve, Naproxen, Ibuprofen, Motrin, Advil, Goody's, BC's, all herbal medications, fish oil, and all vitamins.   After your COVID test   You are not required to quarantine however you are required to wear a well-fitting mask when you are out and around people not in your household.  If your mask becomes wet or soiled, replace with a new one.  Wash your hands often with soap and water for 20 seconds or clean your hands with an alcohol-based hand sanitizer that contains at least 60% alcohol.  Do not share personal items.  Notify your provider: if you are in close contact with someone who has COVID  or if you develop a fever of 100.4 or greater, sneezing, cough, sore throat, shortness of breath or body aches.    DAY OF SURGERY:      Do not wear jewelry  Do not wear lotions, powders, colognes, or deodorant. Men may shave face and neck. Do not  bring valuables to the hospital.             [facility] is not responsible for any belongings or valuables.  Do NOT Smoke (Tobacco/Vaping)  24 hours prior to your procedure  If you use a CPAP at night, you may bring your mask for your overnight stay.   Contacts, glasses, hearing aids, dentures or partials may not be worn into surgery, please bring cases for these belongings   For patients admitted to the hospital, discharge time will be determined by your treatment team.   Patients discharged the day of surgery will not be allowed to drive home, and someone needs to stay with them for 24 hours.  NO VISITORS WILL BE ALLOWED IN PRE-OP WHERE PATIENTS ARE PREPPED FOR SURGERY.  ONLY 1 SUPPORT PERSON MAY BE PRESENT IN THE WAITING ROOM WHILE YOU ARE IN SURGERY.  IF YOU ARE TO BE ADMITTED, ONCE YOU ARE IN YOUR ROOM YOU WILL BE ALLOWED TWO (2) VISITORS. 1 (ONE) VISITOR MAY STAY OVERNIGHT BUT MUST ARRIVE TO THE ROOM BY 8pm.  Minor children may have two parents present. Special consideration for safety and communication needs will be reviewed on a case by case basis.  Special instructions:    Oral Hygiene is also important to reduce your risk of infection.  Remember - BRUSH YOUR TEETH THE MORNING OF SURGERY WITH YOUR REGULAR TOOTHPASTE   [place]- Preparing For Surgery  Before  surgery, you can play an important role. Because skin is not sterile, your skin needs to be as free of germs as possible. You can reduce the number of germs on your skin by washing with CHG (chlorahexidine gluconate) Soap before surgery.  CHG is an antiseptic cleaner which kills germs and bonds with the skin to continue killing germs even after washing.     Please do not use if you have an allergy to CHG or antibacterial soaps. If your skin becomes reddened/irritated stop using the CHG.  Do not shave (including legs and underarms) for at least 48 hours prior to first CHG shower. It is OK to shave your face.  Please follow  these instructions carefully.     Shower the NIGHT BEFORE SURGERY and the MORNING OF SURGERY with CHG Soap.   If you chose to wash your hair, wash your hair first as usual with your normal shampoo. After you shampoo, rinse your hair and body thoroughly to remove the shampoo.  Then [employer] and genitals (private parts) with your normal soap and rinse thoroughly to remove soap.  After that Use CHG Soap as you would any other liquid soap. You can apply CHG directly to the skin and wash gently with a scrungie or a clean washcloth.   Apply the CHG Soap to your body ONLY FROM THE NECK DOWN.  Do not use on open wounds or open sores. Avoid contact with your eyes, ears, mouth and genitals (private parts). Wash Face and genitals (private parts)  with your normal soap.   Wash thoroughly, paying special attention to the area where your surgery will be performed.  Thoroughly rinse your body with warm water from the neck down.  DO NOT shower/wash with your normal soap after using and rinsing off the CHG Soap.  Pat yourself dry with a CLEAN TOWEL.  Wear CLEAN PAJAMAS to bed the night before surgery  Place CLEAN SHEETS on your bed the night before your surgery  DO NOT SLEEP WITH PETS.   Day of Surgery:  Take a shower with CHG soap. Wear Clean/Comfortable clothing the morning of surgery Do not apply any deodorants/lotions.   Remember to brush your teeth WITH YOUR REGULAR TOOTHPASTE.   Please read over the following fact sheets that you were given.

## 2021-05-03 ENCOUNTER — Other Ambulatory Visit: Payer: Self-pay

## 2021-05-03 ENCOUNTER — Encounter (HOSPITAL_COMMUNITY)
Admission: RE | Admit: 2021-05-03 | Discharge: 2021-05-03 | Disposition: A | Discharge status: Home / Self Care | Payer: BC Managed Care – PPO | Source: Ambulatory Visit | Attending: Orthopedic Surgery | Admitting: Orthopedic Surgery

## 2021-05-03 ENCOUNTER — Encounter (HOSPITAL_COMMUNITY): Payer: Self-pay

## 2021-05-03 DIAGNOSIS — I2510 Atherosclerotic heart disease of native coronary artery without angina pectoris: Secondary | ICD-10-CM | POA: Insufficient documentation

## 2021-05-03 DIAGNOSIS — Z01812 Encounter for preprocedural laboratory examination: Secondary | ICD-10-CM | POA: Insufficient documentation

## 2021-05-03 DIAGNOSIS — Z20822 Contact with and (suspected) exposure to covid-19: Secondary | ICD-10-CM | POA: Insufficient documentation

## 2021-05-03 DIAGNOSIS — I10 Essential (primary) hypertension: Secondary | ICD-10-CM | POA: Insufficient documentation

## 2021-05-03 DIAGNOSIS — M5416 Radiculopathy, lumbar region: Secondary | ICD-10-CM | POA: Insufficient documentation

## 2021-05-03 DIAGNOSIS — Z01818 Encounter for other preprocedural examination: Secondary | ICD-10-CM

## 2021-05-03 DIAGNOSIS — G4733 Obstructive sleep apnea (adult) (pediatric): Secondary | ICD-10-CM | POA: Insufficient documentation

## 2021-05-03 DIAGNOSIS — Z7982 Long term (current) use of aspirin: Secondary | ICD-10-CM | POA: Insufficient documentation

## 2021-05-03 HISTORY — DX: Atherosclerotic heart disease of native coronary artery without angina pectoris: I25.10

## 2021-05-03 HISTORY — DX: Angina pectoris, unspecified: I20.9

## 2021-05-03 LAB — PROTIME-INR
INR: 1 (ref 0.8–1.2)
Prothrombin Time: 12.8 seconds (ref 11.4–15.2)

## 2021-05-03 LAB — URINALYSIS, ROUTINE W REFLEX MICROSCOPIC
Bilirubin Urine: NEGATIVE
Glucose, UA: NEGATIVE mg/dL
Hgb urine dipstick: NEGATIVE
Ketones, ur: NEGATIVE mg/dL
Leukocytes,Ua: NEGATIVE
Nitrite: NEGATIVE
Protein, ur: NEGATIVE mg/dL
Specific Gravity, Urine: 1.01 (ref 1.005–1.030)
pH: 6.5 (ref 5.0–8.0)

## 2021-05-03 LAB — SURGICAL PCR SCREEN
MRSA, PCR: NEGATIVE
Staphylococcus aureus: NEGATIVE

## 2021-05-03 LAB — BASIC METABOLIC PANEL
Anion gap: 6 (ref 5–15)
BUN: 14 mg/dL (ref 6–20)
CO2: 31 mmol/L (ref 22–32)
Calcium: 9.5 mg/dL (ref 8.9–10.3)
Chloride: 101 mmol/L (ref 98–111)
Creatinine, Ser: 1.11 mg/dL (ref 0.61–1.24)
GFR, Estimated: >60 mL/min (ref >60)
Glucose, Bld: 91 mg/dL (ref 70–99)
Potassium: 3.8 mmol/L (ref 3.5–5.1)
Sodium: 138 mmol/L (ref 135–145)

## 2021-05-03 LAB — CBC
HCT: 45.4 % (ref 39.0–52.0)
Hemoglobin: 15.5 g/dL (ref 13.0–17.0)
MCH: 30.7 pg (ref 26.0–34.0)
MCHC: 34.1 g/dL (ref 30.0–36.0)
MCV: 89.9 fL (ref 80.0–100.0)
Platelets: 234 10*3/uL (ref 150–400)
RBC: 5.05 MIL/uL (ref 4.22–5.81)
RDW: 12.5 % (ref 11.5–15.5)
WBC: 8.3 10*3/uL (ref 4.0–10.5)
nRBC: 0 % (ref 0.0–0.2)

## 2021-05-03 LAB — SARS CORONAVIRUS 2 (TAT 6-24 HRS): SARS Coronavirus 2: NEGATIVE

## 2021-05-03 LAB — TYPE AND SCREEN
ABO/RH(D): O POS
Antibody Screen: NEGATIVE

## 2021-05-03 LAB — APTT: aPTT: 27 seconds (ref 24–36)

## 2021-05-03 NOTE — Progress Notes (Signed)
PCP - Darel Hong, PA Cardiologist - Dr. Antoine Poche has had clearance   Chest x-ray - Not indicated EKG - 03/17/21 Stress Test - 09/07/20 ECHO - 2012 Cardiac Cath - 03/21/21 and 06/22/19   Sleep Study - Yes no OSA CPAP -   DM - Denies  Blood Thinner Instructions: Plavix has been stopped since October 2022 Aspirin Instructions: Per patient Dr. Dairl Ponder nurse said it was ok for him to continue the ASA  COVID TEST- 05/03/21   Anesthesia review: Yes has cardiac history  Patient denies shortness of breath, fever, cough and chest pain at PAT appointment   All instructions explained to the patient, with a verbal understanding of the material. Patient agrees to go over the instructions while at home for a better understanding. Patient also instructed to wear a mask while in public after being tested for COVID-19. The opportunity to ask questions was provided.

## 2021-05-03 NOTE — Progress Notes (Addendum)
Anesthesia Chart Review:   Case: [id number] Date/Time: 05/04/21 0715   Procedure: TRANSFORAMINAL LUMBAR INTERBODY FUSION (TLIF) WITH PEDICLE SCREW FIXATION 1 LEVEL (TLIF L5-S1)   Anesthesia type: General   Pre-op diagnosis: Degenerative slip with left leg radiculopathy L5-S1   [facility] RO[facility] 04 / [facility]   Surgeons: Venita Lick, MD       DISCUSSION: Patient is a 55 year old male scheduled for the above procedure.  History includes never smoker, HTN, CAD (DES mLAD, jailed D2 branch 60% ostial stenosis with TIMI3 flow 06/22/19), spinal surgery (cervical discectomy 2016). Overall no significant OSA on 08/20/19 sleep study, but with mild OSA with supine position and during REM sleep (does not use CPAP).   He recently underwent LHC 03/31/21 for recurrent chest pain. Results showed non-obstructive CAD with patent LAD stent. Preoperative cardiology input outlined by Micah Flesher, PA-C on 04/05/21, "[place] Lewis Profeta was last seen on 03/17/21 by Joni Reining NP.  Since that day, he underwent repeat angiography which showed patent LAD stent and nonobstructive disease. He was cleared for surgery. We were also asked to hold ASA. Given his CAD and prior stenting, we prefer to continue ASA throughout the perioperative period. If doing do will significant increase morbidity and mortality, may hold 5-7 days if necessary.    Therefore, based on ACC/AHA guidelines, the patient would be at acceptable risk for the planned procedure without further cardiovascular testing..."  Per PAT RN documentation, patient reported he was told by staff at Dr. Shon Baton' office that he could continue ASA for surgery. I spoke with Cordelia Pen at Dr. Shon Baton' office to clarify--she will contact patient to verify if he is still taking ASA, and if so, will review with Dr. Shon Baton.  Preoperative medical evaluation by Roderick Pee, PA on 04/11/21 [facility] Everywhere). Note reviewed.   05/03/21 presurgical COVID-19 test in process.  Anesthesia team to evaluate on the day of surgery.     VS: BP (!) 130/97   Pulse 69   Temp 36.9 C   Resp 17   Ht 5\' 10"  (1.778 m)   Wt 111.1 kg   SpO2 99%   BMI 35.15 kg/m    PROVIDERS: , PA is PCP  Roderick Pee, MD is cardiologist   LABS: Labs reviewed: Acceptable for surgery. Total bili 1.7, AST 25, ALT 25 09/05/20 ([facility]). (all labs ordered are listed, but only abnormal results are displayed)  Labs Reviewed  SURGICAL PCR SCREEN  SARS CORONAVIRUS 2 (TAT 6-24 HRS)  CBC  BASIC METABOLIC PANEL  PROTIME-INR  APTT  URINALYSIS, ROUTINE W REFLEX MICROSCOPIC  TYPE AND SCREEN    Sleep Study (NPSG) 08/20/19: IMPRESSIONS - Increased Upper Airway Resistance Syndrome (UARS) without significant obstructive sleep apnea overall (AHI 4.2/h); however, mild sleep apnea was present with supine position (AHI 5.2/h) and during REM sleep (AHI 9.6/h).  - No significant central sleep apnea occurred during this study (CAI = 0.2/h). - Mild oxygen desaturation to a nadir of 87.0%. - The patient snored with soft snoring volume. - No cardiac abnormalities were noted during this study. - Clinically significant periodic limb movements did not occur during sleep. No significant associated arousals. RECOMMENDATIONS - Effort should be made to optimize nasal and oropharyngeal patency. - Consider initial alternatives for snoring and CPAP therapy with consideration of a customized oral appliance.  - If symptoms progress consider a future PSG evaluation...    EKG: 03/17/21: SB at 59 bpm   CV: Cardiac cath 03/21/21:  Dist LAD lesion is 40% stenosed.   1st Mrg lesion is 20% stenosed.   2nd Diag lesion is 35% stenosed.   Non-stenotic Mid LAD-2 lesion was previously treated.   Non-stenotic Mid LAD-1 lesion was previously treated.   The left ventricular systolic function is normal.   LV end diastolic pressure is mildly elevated.   The left ventricular ejection fraction is 55-65%  by visual estimate.   Nonobstructive CAD. Prior LAD stent is widely patent.  Normal LV function Mildly elevated LVEDP   Plan: continue medical therapy. He may discontinue Plavix at this point and he is cleared to have back surgery.    ETT 09/07/20: Blood pressure demonstrated a normal response to exercise. There was no ST segment deviation noted during stress. No T wave inversion was noted during stress. Normal ECG stress test.   Ziopatch XT 07/08/19-07/14/19 (Preliminary):  Patient had a min HR of 53 bpm, max HR of 162 bpm, and avg HR of 80 bpm. Predominant underlying rhythm was Sinus Rhythm. Isolated SVEs were rare (<1.0%), SVE Triplets were rare (<1.0%), and no SVE Couplets were present. No Isolated VEs, VE Couplets, or VE Triplets were present.   Past Medical History:  Diagnosis Date   Anginal pain (HCC)    Chest discomfort 2014   due to food poisoning/went to hospital per EMS   Hypertension     Past Surgical History:  Procedure Laterality Date   CERVICAL DISCECTOMY  2016   C4-5   CORONARY STENT INTERVENTION N/A 06/22/2019   Procedure: CORONARY STENT INTERVENTION;  Surgeon: Yvonne Kendall, MD;  [facility] CV LAB;  Service: Cardiovascular;  Laterality: N/A;   LEFT HEART CATH AND CORONARY ANGIOGRAPHY N/A 06/22/2019   Procedure: LEFT HEART CATH AND CORONARY ANGIOGRAPHY;  Surgeon: Yvonne Kendall, MD;  [facility];  Service: Cardiovascular;  Laterality: N/A;   LEFT HEART CATH AND CORONARY ANGIOGRAPHY N/A 03/21/2021   Procedure: LEFT HEART CATH AND CORONARY ANGIOGRAPHY;  Surgeon: Swazil[place], Peter M, MD;  Location: [facility] INVASIVE CV LAB;  Service: Cardiovascular;  Laterality: N/A;   WRIST SURGERY     Left    MEDICATIONS:  acetaminophen (TYLENOL) 325 MG tablet   aspirin EC 81 MG tablet   diclofenac (VOLTAREN) 75 MG EC tablet   fluticasone (FLONASE) 50 MCG/ACT nasal spray   gabapentin (NEURONTIN) 300 MG capsule   lisinopril-hydrochlorothiazide  (ZESTORETIC) 10-12.5 MG tablet   loratadine (CLARITIN) 10 MG tablet   metoprolol succinate (TOPROL-XL) 25 MG 24 hr tablet   Multiple Vitamin (MULTIVITAMIN WITH MINERALS) TABS tablet   nitroGLYCERIN (NITROSTAT) 0.4 MG SL tablet   rosuvastatin (CRESTOR) 20 MG tablet   No current facility-administered medications for this encounter.    Shonna Chock, PA-C Surgical Short Stay/[facility] Phone [phone] [facility] Phone [phone] 05/03/2021 1:20 PM

## 2021-05-03 NOTE — Anesthesia Preprocedure Evaluation (Addendum)
Anesthesia Evaluation  Patient identified by MRN, date of birth, ID band Patient awake    Reviewed: Allergy & Precautions, NPO status , Patient's Chart, lab work & pertinent test results, reviewed documented beta blocker date and time   History of Anesthesia Complications Negative for: history of anesthetic complications  Airway Mallampati: II  TM Distance: >3 FB Neck ROM: Full    Dental  (+) Chipped, Dental Advisory Given   Pulmonary sleep apnea (does not use CPAP) ,  05/03/2021 SARS coronavirus NEG   breath sounds clear to auscultation       Cardiovascular hypertension, Pt. on medications and Pt. on home beta blockers (-) angina+ CAD and + Cardiac Stents (LAD)   Rhythm:Regular Rate:Normal  03/31/2021 cath: non-obstructive ASCAD with patent LAD stent, normal LVF   Neuro/Psych Chronic back pain negative psych ROS   GI/Hepatic Neg liver ROS, GERD  Controlled,  Endo/Other  Morbid obesity  Renal/GU negative Renal ROS     Musculoskeletal   Abdominal (+) + obese,   Peds  Hematology negative hematology ROS (+)   Anesthesia Other Findings   Reproductive/Obstetrics                           Anesthesia Physical Anesthesia Plan  ASA: 3  Anesthesia Plan: General   Post-op Pain Management: Dilaudid IV and Tylenol PO (pre-op)   Induction: Intravenous  PONV Risk Score and Plan: 2 and Ondansetron and Dexamethasone  Airway Management Planned: Oral ETT  Additional Equipment: None  Intra-op Plan:   Post-operative Plan: Extubation in OR  Informed Consent: I have reviewed the patients History and Physical, chart, labs and discussed the procedure including the risks, benefits and alternatives for the proposed anesthesia with the patient or authorized representative who has indicated his/her understanding and acceptance.     Dental advisory given  Plan Discussed with: CRNA and  Surgeon  Anesthesia Plan Comments: (PAT note written 05/03/2021 by Shonna Chock, PA-C. )      Anesthesia Quick Evaluation

## 2021-05-03 NOTE — Progress Notes (Signed)
   05/03/21 0821  OBSTRUCTIVE SLEEP APNEA  Have you ever been diagnosed with sleep apnea through a sleep study? No  Do you snore loudly (loud enough to be heard through closed doors)?  0  Do you often feel tired, fatigued, or sleepy during the daytime (such as falling asleep during driving or talking to someone)? 0  Has anyone observed you stop breathing during your sleep? 0  Do you have, or are you being treated for high blood pressure? 1  BMI more than 35 kg/m2? 1  Age > 50 (1-yes) 1  Neck circumference greater than:Male 16 inches or larger, Male 17inches or larger? 1  Male Gender (Yes=1) 1  Obstructive Sleep Apnea Score 5

## 2021-05-04 ENCOUNTER — Other Ambulatory Visit: Payer: Self-pay

## 2021-05-04 ENCOUNTER — Inpatient Hospital Stay (HOSPITAL_COMMUNITY): Payer: BC Managed Care – PPO

## 2021-05-04 ENCOUNTER — Encounter (HOSPITAL_COMMUNITY): Payer: Self-pay | Admitting: Orthopedic Surgery

## 2021-05-04 ENCOUNTER — Inpatient Hospital Stay (HOSPITAL_COMMUNITY): Payer: BC Managed Care – PPO | Admitting: Anesthesiology

## 2021-05-04 ENCOUNTER — Inpatient Hospital Stay (HOSPITAL_COMMUNITY): Payer: BC Managed Care – PPO | Admitting: Vascular Surgery

## 2021-05-04 ENCOUNTER — Encounter (HOSPITAL_COMMUNITY)
Admission: RE | Disposition: A | Discharge status: Home / Self Care | Payer: Self-pay | Source: Home / Self Care | Attending: Orthopedic Surgery

## 2021-05-04 ENCOUNTER — Inpatient Hospital Stay (HOSPITAL_COMMUNITY)
Admission: RE | Admit: 2021-05-04 | Discharge: 2021-05-05 | DRG: 460 | Disposition: A | Discharge status: Home / Self Care | Payer: BC Managed Care – PPO | Attending: Orthopedic Surgery | Admitting: Orthopedic Surgery

## 2021-05-04 DIAGNOSIS — Z981 Arthrodesis status: Secondary | ICD-10-CM

## 2021-05-04 DIAGNOSIS — Z6835 Body mass index (BMI) 35.0-35.9, adult: Secondary | ICD-10-CM | POA: Diagnosis not present

## 2021-05-04 DIAGNOSIS — I2510 Atherosclerotic heart disease of native coronary artery without angina pectoris: Secondary | ICD-10-CM | POA: Diagnosis present

## 2021-05-04 DIAGNOSIS — Z79899 Other long term (current) drug therapy: Secondary | ICD-10-CM | POA: Diagnosis not present

## 2021-05-04 DIAGNOSIS — Z419 Encounter for procedure for purposes other than remedying health state, unspecified: Secondary | ICD-10-CM

## 2021-05-04 DIAGNOSIS — M5417 Radiculopathy, lumbosacral region: Secondary | ICD-10-CM | POA: Diagnosis present

## 2021-05-04 DIAGNOSIS — M4317 Spondylolisthesis, lumbosacral region: Principal | ICD-10-CM | POA: Diagnosis present

## 2021-05-04 DIAGNOSIS — E669 Obesity, unspecified: Secondary | ICD-10-CM | POA: Diagnosis present

## 2021-05-04 DIAGNOSIS — G4733 Obstructive sleep apnea (adult) (pediatric): Secondary | ICD-10-CM | POA: Diagnosis present

## 2021-05-04 DIAGNOSIS — E785 Hyperlipidemia, unspecified: Secondary | ICD-10-CM | POA: Diagnosis present

## 2021-05-04 DIAGNOSIS — I10 Essential (primary) hypertension: Secondary | ICD-10-CM | POA: Diagnosis present

## 2021-05-04 DIAGNOSIS — Z20822 Contact with and (suspected) exposure to covid-19: Secondary | ICD-10-CM | POA: Diagnosis present

## 2021-05-04 DIAGNOSIS — Z955 Presence of coronary angioplasty implant and graft: Secondary | ICD-10-CM | POA: Diagnosis not present

## 2021-05-04 HISTORY — PX: TRANSFORAMINAL LUMBAR INTERBODY FUSION (TLIF) WITH PEDICLE SCREW FIXATION 1 LEVEL: SHX6141

## 2021-05-04 LAB — ABO/RH: ABO/RH(D): O POS

## 2021-05-04 SURGERY — TRANSFORAMINAL LUMBAR INTERBODY FUSION (TLIF) WITH PEDICLE SCREW FIXATION 1 LEVEL
Anesthesia: General | Site: Spine Lumbar

## 2021-05-04 MED ORDER — MIDAZOLAM HCL 2 MG/2ML IJ SOLN
INTRAMUSCULAR | Status: AC
  Filled 2021-05-04: qty 2

## 2021-05-04 MED ORDER — LISINOPRIL-HYDROCHLOROTHIAZIDE 10-12.5 MG PO TABS: 1.0000 | ORAL_TABLET | Freq: Every day | ORAL | Status: DC

## 2021-05-04 MED ORDER — ACETAMINOPHEN 500 MG PO TABS
1000.0000 mg | ORAL_TABLET | Freq: Once | ORAL | Status: AC
  Administered 2021-05-04: 1000 mg via ORAL
  Filled 2021-05-04: qty 2

## 2021-05-04 MED ORDER — LIDOCAINE 2% (20 MG/ML) 5 ML SYRINGE
INTRAMUSCULAR | Status: DC | PRN
  Administered 2021-05-04: 60 mg via INTRAVENOUS

## 2021-05-04 MED ORDER — FLUTICASONE PROPIONATE 50 MCG/ACT NA SUSP
1.0000 | Freq: Every day | NASAL | Status: DC | PRN
  Filled 2021-05-04: qty 16

## 2021-05-04 MED ORDER — PHENYLEPHRINE 40 MCG/ML (10ML) SYRINGE FOR IV PUSH (FOR BLOOD PRESSURE SUPPORT)
PREFILLED_SYRINGE | INTRAVENOUS | Status: DC | PRN
  Administered 2021-05-04: 80 ug via INTRAVENOUS

## 2021-05-04 MED ORDER — LIDOCAINE 2% (20 MG/ML) 5 ML SYRINGE
INTRAMUSCULAR | Status: AC
  Filled 2021-05-04: qty 5

## 2021-05-04 MED ORDER — DEXAMETHASONE SODIUM PHOSPHATE 10 MG/ML IJ SOLN
INTRAMUSCULAR | Status: DC | PRN
  Administered 2021-05-04: 10 mg via INTRAVENOUS

## 2021-05-04 MED ORDER — ONDANSETRON HCL 4 MG/2ML IJ SOLN
INTRAMUSCULAR | Status: AC
  Filled 2021-05-04: qty 2

## 2021-05-04 MED ORDER — ACETAMINOPHEN 650 MG RE SUPP: 650.0000 mg | RECTAL | Status: DC | PRN

## 2021-05-04 MED ORDER — FENTANYL CITRATE (PF) 250 MCG/5ML IJ SOLN
INTRAMUSCULAR | Status: AC
  Filled 2021-05-04: qty 5

## 2021-05-04 MED ORDER — PHENYLEPHRINE HCL-NACL 20-0.9 MG/250ML-% IV SOLN
INTRAVENOUS | Status: DC | PRN
  Administered 2021-05-04: 25 ug/min via INTRAVENOUS

## 2021-05-04 MED ORDER — HYDROCHLOROTHIAZIDE 12.5 MG PO TABS
12.5000 mg | ORAL_TABLET | Freq: Every day | ORAL | Status: DC
  Administered 2021-05-04: 12.5 mg via ORAL
  Filled 2021-05-04: qty 1

## 2021-05-04 MED ORDER — THROMBIN 20000 UNITS EX SOLR
CUTANEOUS | Status: AC
  Filled 2021-05-04: qty 20000

## 2021-05-04 MED ORDER — ONDANSETRON HCL 4 MG/2ML IJ SOLN
INTRAMUSCULAR | Status: DC | PRN
  Administered 2021-05-04: 4 mg via INTRAVENOUS

## 2021-05-04 MED ORDER — HEMOSTATIC AGENTS (NO CHARGE) OPTIME
TOPICAL | Status: DC | PRN
  Administered 2021-05-04 (×3): 1 via TOPICAL

## 2021-05-04 MED ORDER — METHOCARBAMOL 500 MG PO TABS
500.0000 mg | ORAL_TABLET | Freq: Four times a day (QID) | ORAL | Status: DC | PRN
  Administered 2021-05-04 – 2021-05-05 (×3): 500 mg via ORAL
  Filled 2021-05-04 (×4): qty 1

## 2021-05-04 MED ORDER — CHLORHEXIDINE GLUCONATE 0.12 % MT SOLN
15.0000 mL | Freq: Once | OROMUCOSAL | Status: AC
  Administered 2021-05-04: 15 mL via OROMUCOSAL
  Filled 2021-05-04: qty 15

## 2021-05-04 MED ORDER — BUPIVACAINE-EPINEPHRINE 0.25% -1:200000 IJ SOLN
INTRAMUSCULAR | Status: DC | PRN
  Administered 2021-05-04: 20 mL

## 2021-05-04 MED ORDER — ONDANSETRON HCL 4 MG PO TABS
4.0000 mg | ORAL_TABLET | Freq: Three times a day (TID) | ORAL | 0 refills | Status: DC | PRN
  Filled 2021-05-05: qty 20, 7d supply, fill #0

## 2021-05-04 MED ORDER — OXYCODONE HCL 5 MG PO TABS: 5.0000 mg | ORAL_TABLET | Freq: Once | ORAL | Status: DC | PRN

## 2021-05-04 MED ORDER — OXYCODONE-ACETAMINOPHEN 10-325 MG PO TABS: 1.0000 | ORAL_TABLET | Freq: Four times a day (QID) | ORAL | 0 refills | Status: AC | PRN

## 2021-05-04 MED ORDER — METOPROLOL SUCCINATE 12.5 MG HALF TABLET
12.5000 mg | ORAL_TABLET | Freq: Every day | ORAL | Status: DC
  Filled 2021-05-04: qty 1

## 2021-05-04 MED ORDER — MEPERIDINE HCL 25 MG/ML IJ SOLN: 6.2500 mg | INTRAMUSCULAR | Status: DC | PRN

## 2021-05-04 MED ORDER — SODIUM CHLORIDE 0.9% FLUSH: 3.0000 mL | INTRAVENOUS | Status: DC | PRN

## 2021-05-04 MED ORDER — ORAL CARE MOUTH RINSE: 15.0000 mL | Freq: Once | OROMUCOSAL | Status: AC

## 2021-05-04 MED ORDER — LACTATED RINGERS IV SOLN: INTRAVENOUS | Status: DC | PRN

## 2021-05-04 MED ORDER — OXYCODONE HCL 5 MG/5ML PO SOLN: 5.0000 mg | Freq: Once | ORAL | Status: DC | PRN

## 2021-05-04 MED ORDER — PROPOFOL 10 MG/ML IV BOLUS
INTRAVENOUS | Status: DC | PRN
  Administered 2021-05-04: 60 mg via INTRAVENOUS
  Administered 2021-05-04: 200 mg via INTRAVENOUS
  Administered 2021-05-04: 30 mg via INTRAVENOUS
  Administered 2021-05-04: 20 mg via INTRAVENOUS

## 2021-05-04 MED ORDER — GABAPENTIN 300 MG PO CAPS
300.0000 mg | ORAL_CAPSULE | Freq: Every day | ORAL | Status: DC
  Administered 2021-05-04: 300 mg via ORAL
  Filled 2021-05-04: qty 1

## 2021-05-04 MED ORDER — SUCCINYLCHOLINE CHLORIDE 200 MG/10ML IV SOSY
PREFILLED_SYRINGE | INTRAVENOUS | Status: AC
  Filled 2021-05-04: qty 10

## 2021-05-04 MED ORDER — PHENOL 1.4 % MT LIQD: 1.0000 | OROMUCOSAL | Status: DC | PRN

## 2021-05-04 MED ORDER — ONDANSETRON HCL 4 MG/2ML IJ SOLN: 4.0000 mg | Freq: Four times a day (QID) | INTRAMUSCULAR | Status: DC | PRN

## 2021-05-04 MED ORDER — 0.9 % SODIUM CHLORIDE (POUR BTL) OPTIME
TOPICAL | Status: DC | PRN
  Administered 2021-05-04 (×2): 1000 mL

## 2021-05-04 MED ORDER — OXYCODONE HCL 5 MG PO TABS: 5.0000 mg | ORAL_TABLET | ORAL | Status: DC | PRN

## 2021-05-04 MED ORDER — ACETAMINOPHEN 325 MG PO TABS
650.0000 mg | ORAL_TABLET | ORAL | Status: DC | PRN
  Administered 2021-05-04: 650 mg via ORAL
  Filled 2021-05-04: qty 2

## 2021-05-04 MED ORDER — SUCCINYLCHOLINE CHLORIDE 200 MG/10ML IV SOSY
PREFILLED_SYRINGE | INTRAVENOUS | Status: DC | PRN
  Administered 2021-05-04: 160 mg via INTRAVENOUS

## 2021-05-04 MED ORDER — CEFAZOLIN SODIUM-DEXTROSE 1-4 GM/50ML-% IV SOLN
1.0000 g | Freq: Three times a day (TID) | INTRAVENOUS | Status: AC
  Administered 2021-05-04 (×2): 1 g via INTRAVENOUS
  Filled 2021-05-04 (×2): qty 50

## 2021-05-04 MED ORDER — SODIUM CHLORIDE 0.9 % IV SOLN: 250.0000 mL | INTRAVENOUS | Status: DC

## 2021-05-04 MED ORDER — TRANEXAMIC ACID 1000 MG/10ML IV SOLN
2000.0000 mg | Freq: Once | INTRAVENOUS | Status: DC
  Filled 2021-05-04: qty 20

## 2021-05-04 MED ORDER — ROSUVASTATIN CALCIUM 20 MG PO TABS: 20.0000 mg | ORAL_TABLET | Freq: Every day | ORAL | Status: DC

## 2021-05-04 MED ORDER — OXYCODONE HCL 5 MG PO TABS
10.0000 mg | ORAL_TABLET | ORAL | Status: DC | PRN
  Administered 2021-05-04 – 2021-05-05 (×6): 10 mg via ORAL
  Filled 2021-05-04 (×6): qty 2

## 2021-05-04 MED ORDER — BUPIVACAINE-EPINEPHRINE (PF) 0.25% -1:200000 IJ SOLN
INTRAMUSCULAR | Status: AC
  Filled 2021-05-04: qty 30

## 2021-05-04 MED ORDER — MENTHOL 3 MG MT LOZG
1.0000 | LOZENGE | OROMUCOSAL | Status: DC | PRN
  Filled 2021-05-04: qty 9

## 2021-05-04 MED ORDER — ONDANSETRON HCL 4 MG PO TABS: 4.0000 mg | ORAL_TABLET | Freq: Four times a day (QID) | ORAL | Status: DC | PRN

## 2021-05-04 MED ORDER — CEFAZOLIN SODIUM-DEXTROSE 2-4 GM/100ML-% IV SOLN
2.0000 g | INTRAVENOUS | Status: AC
  Administered 2021-05-04: 2 g via INTRAVENOUS
  Filled 2021-05-04: qty 100

## 2021-05-04 MED ORDER — MIDAZOLAM HCL 2 MG/2ML IJ SOLN: 0.5000 mg | Freq: Once | INTRAMUSCULAR | Status: DC | PRN

## 2021-05-04 MED ORDER — PROPOFOL 10 MG/ML IV BOLUS
INTRAVENOUS | Status: AC
  Filled 2021-05-04: qty 20

## 2021-05-04 MED ORDER — METHOCARBAMOL 500 MG PO TABS
500.0000 mg | ORAL_TABLET | Freq: Three times a day (TID) | ORAL | 0 refills | Status: AC | PRN
  Filled 2021-05-05: qty 15, 5d supply, fill #0

## 2021-05-04 MED ORDER — PROMETHAZINE HCL 25 MG/ML IJ SOLN: 6.2500 mg | INTRAMUSCULAR | Status: DC | PRN

## 2021-05-04 MED ORDER — DEXAMETHASONE SODIUM PHOSPHATE 10 MG/ML IJ SOLN
INTRAMUSCULAR | Status: AC
  Filled 2021-05-04: qty 1

## 2021-05-04 MED ORDER — MIDAZOLAM HCL 2 MG/2ML IJ SOLN
INTRAMUSCULAR | Status: DC | PRN
  Administered 2021-05-04: 2 mg via INTRAVENOUS

## 2021-05-04 MED ORDER — METHOCARBAMOL 1000 MG/10ML IJ SOLN
500.0000 mg | Freq: Four times a day (QID) | INTRAVENOUS | Status: DC | PRN
  Filled 2021-05-04: qty 5

## 2021-05-04 MED ORDER — TRANEXAMIC ACID 1000 MG/10ML IV SOLN
INTRAVENOUS | Status: DC | PRN
  Administered 2021-05-04: 2000 mg via TOPICAL

## 2021-05-04 MED ORDER — THROMBIN 20000 UNITS EX SOLR
CUTANEOUS | Status: DC | PRN
  Administered 2021-05-04: 20 mL via TOPICAL

## 2021-05-04 MED ORDER — HYDROMORPHONE HCL 1 MG/ML IJ SOLN: 1.0000 mg | INTRAMUSCULAR | Status: AC | PRN

## 2021-05-04 MED ORDER — FENTANYL CITRATE (PF) 250 MCG/5ML IJ SOLN
INTRAMUSCULAR | Status: DC | PRN
  Administered 2021-05-04 (×3): 50 ug via INTRAVENOUS
  Administered 2021-05-04: 200 ug via INTRAVENOUS
  Administered 2021-05-04 (×3): 50 ug via INTRAVENOUS

## 2021-05-04 MED ORDER — NITROGLYCERIN 0.4 MG SL SUBL: 0.4000 mg | SUBLINGUAL_TABLET | SUBLINGUAL | Status: DC | PRN

## 2021-05-04 MED ORDER — SODIUM CHLORIDE 0.9% FLUSH
3.0000 mL | Freq: Two times a day (BID) | INTRAVENOUS | Status: DC
  Administered 2021-05-04 (×2): 3 mL via INTRAVENOUS

## 2021-05-04 MED ORDER — HYDROMORPHONE HCL 1 MG/ML IJ SOLN: 0.2500 mg | INTRAMUSCULAR | Status: DC | PRN

## 2021-05-04 MED ORDER — SENNOSIDES-DOCUSATE SODIUM 8.6-50 MG PO TABS
1.0000 | ORAL_TABLET | Freq: Two times a day (BID) | ORAL | Status: DC
  Administered 2021-05-04 – 2021-05-05 (×3): 1 via ORAL
  Filled 2021-05-04 (×3): qty 1

## 2021-05-04 MED ORDER — PROPOFOL 500 MG/50ML IV EMUL
INTRAVENOUS | Status: DC | PRN
  Administered 2021-05-04: 100 ug/kg/min via INTRAVENOUS

## 2021-05-04 MED ORDER — LISINOPRIL 10 MG PO TABS
10.0000 mg | ORAL_TABLET | Freq: Every day | ORAL | Status: DC
  Administered 2021-05-04: 10 mg via ORAL
  Filled 2021-05-04: qty 1

## 2021-05-04 MED ORDER — ROCURONIUM BROMIDE 10 MG/ML (PF) SYRINGE
PREFILLED_SYRINGE | INTRAVENOUS | Status: AC
  Filled 2021-05-04: qty 10

## 2021-05-04 MED ORDER — LACTATED RINGERS IV SOLN: INTRAVENOUS | Status: DC

## 2021-05-04 SURGICAL SUPPLY — 76 items
AGENT HMST KT MTR STRL THRMB (HEMOSTASIS) ×2
BAG COUNTER SPONGE SURGICOUNT (BAG) ×2 IMPLANT
BAG SPNG CNTER NS LX DISP (BAG) ×1
BLADE CLIPPER SURG (BLADE) ×1 IMPLANT
BUR EGG ELITE 4.0 (BURR) IMPLANT
CABLE BIPOLOR RESECTION CORD (MISCELLANEOUS) ×1 IMPLANT
CANISTER SUCT 3000ML PPV (MISCELLANEOUS) ×2 IMPLANT
CLIP NEUROVISION LG (CLIP) ×1 IMPLANT
CLSR STERI-STRIP ANTIMIC 1/2X4 (GAUZE/BANDAGES/DRESSINGS) ×1 IMPLANT
COVER SURGICAL LIGHT HANDLE (MISCELLANEOUS) ×2 IMPLANT
DRAIN CHANNEL 15F RND FF W/TCR (WOUND CARE) IMPLANT
DRAPE C-ARM 42X72 X-RAY (DRAPES) ×2 IMPLANT
DRAPE C-ARMOR (DRAPES) ×2 IMPLANT
DRAPE POUCH INSTRU U-SHP 10X18 (DRAPES) ×2 IMPLANT
DRAPE SURG 17X23 STRL (DRAPES) ×2 IMPLANT
DRAPE U-SHAPE 47X51 STRL (DRAPES) ×2 IMPLANT
DRSG OPSITE POSTOP 4X6 (GAUZE/BANDAGES/DRESSINGS) ×2 IMPLANT
DRSG OPSITE POSTOP 4X8 (GAUZE/BANDAGES/DRESSINGS) ×1 IMPLANT
DURAPREP 26ML APPLICATOR (WOUND CARE) ×2 IMPLANT
ELECT BLADE 4.0 EZ CLEAN MEGAD (MISCELLANEOUS) ×2
ELECT BLADE 6.5 EXT (BLADE) IMPLANT
ELECT PENCIL ROCKER SW 15FT (MISCELLANEOUS) ×2 IMPLANT
ELECT REM PT RETURN 9FT ADLT (ELECTROSURGICAL) ×2
ELECTRODE BLDE 4.0 EZ CLN MEGD (MISCELLANEOUS) ×1 IMPLANT
ELECTRODE REM PT RTRN 9FT ADLT (ELECTROSURGICAL) ×1 IMPLANT
GLOVE SURG MICRO LTX SZ8.5 (GLOVE) ×2 IMPLANT
GLOVE SURG UNDER POLY LF SZ8.5 (GLOVE) ×2 IMPLANT
GOWN STRL REUS W/ TWL LRG LVL3 (GOWN DISPOSABLE) ×1 IMPLANT
GOWN STRL REUS W/TWL 2XL LVL3 (GOWN DISPOSABLE) ×4 IMPLANT
GOWN STRL REUS W/TWL LRG LVL3 (GOWN DISPOSABLE) ×2
GUIDEWIRE NITINOL BEVEL TIP (WIRE) ×4 IMPLANT
IMPL TLX20 10X11X31 20D (Cage) IMPLANT
KIT BASIN OR (CUSTOM PROCEDURE TRAY) ×2 IMPLANT
KIT POSITION SURG JACKSON T1 (MISCELLANEOUS) ×1 IMPLANT
KIT TURNOVER KIT B (KITS) ×2 IMPLANT
MODULE EMG NDL SSEP NVM5 (NEEDLE) IMPLANT
MODULE EMG NEEDLE SSEP NVM5 (NEEDLE) ×2 IMPLANT
MODULE NVM5 NEXT GEN EMG (NEEDLE) ×1 IMPLANT
NDL I-PASS III (NEEDLE) IMPLANT
NDL SPNL 18GX3.5 QUINCKE PK (NEEDLE) ×2 IMPLANT
NEEDLE 22X1 1/2 (OR ONLY) (NEEDLE) ×2 IMPLANT
NEEDLE I-PASS III (NEEDLE) ×2 IMPLANT
NEEDLE SPNL 18GX3.5 QUINCKE PK (NEEDLE) ×4 IMPLANT
NS IRRIG 1000ML POUR BTL (IV SOLUTION) ×2 IMPLANT
PACK LAMINECTOMY ORTHO (CUSTOM PROCEDURE TRAY) ×2 IMPLANT
PACK UNIVERSAL I (CUSTOM PROCEDURE TRAY) ×2 IMPLANT
PAD ARMBOARD 7.5X6 YLW CONV (MISCELLANEOUS) ×4 IMPLANT
PATTIES SURGICAL .5 X.5 (GAUZE/BANDAGES/DRESSINGS) ×1 IMPLANT
PATTIES SURGICAL .5 X1 (DISPOSABLE) ×2 IMPLANT
POSITIONER HEAD PRONE TRACH (MISCELLANEOUS) ×2 IMPLANT
PROBE BALL TIP NVM5 SNG USE (BALLOONS) ×1 IMPLANT
REDUCTION EXT RELINE MAS MOD (Neuro Prosthesis/Implant) ×2 IMPLANT
ROD RELINE MAS TI 5.5X35MM LRD (Rod) ×1 IMPLANT
ROD RELINE MAS TI LORD 5.5X40 (Rod) ×1 IMPLANT
SCREW LOCK RELINE 5.5 TULIP (Screw) ×4 IMPLANT
SCREW RED MAS POLY 7.5X40MM (Screw) ×1 IMPLANT
SCREW RELINE RED 6.5X45MM POLY (Screw) ×1 IMPLANT
SCREW SHANK RELINE 6.5X45MM 2C (Screw) ×1 IMPLANT
SCREW SHANK RELINE 7.5X40MM 2C (Screw) ×1 IMPLANT
SPONGE SURGIFOAM ABS GEL 100 (HEMOSTASIS) ×2 IMPLANT
SPONGE T-LAP 4X18 ~~LOC~~+RFID (SPONGE) ×5 IMPLANT
STRIP CLOSURE SKIN 1/2X4 (GAUZE/BANDAGES/DRESSINGS) ×2 IMPLANT
SURGIFLO W/THROMBIN 8M KIT (HEMOSTASIS) ×2 IMPLANT
SUT BONE WAX W31G (SUTURE) ×2 IMPLANT
SUT MNCRL AB 3-0 PS2 27 (SUTURE) ×4 IMPLANT
SUT VIC AB 1 CT1 18XCR BRD 8 (SUTURE) ×1 IMPLANT
SUT VIC AB 1 CT1 8-18 (SUTURE) ×6
SUT VIC AB 2-0 CT1 18 (SUTURE) ×5 IMPLANT
SYR BULB IRRIG 60ML STRL (SYRINGE) ×2 IMPLANT
SYR CONTROL 10ML LL (SYRINGE) ×2 IMPLANT
TLX20 IMPLANT 10X11X31 20D (Cage) ×2 IMPLANT
TOWEL GREEN STERILE (TOWEL DISPOSABLE) ×2 IMPLANT
TOWEL GREEN STERILE FF (TOWEL DISPOSABLE) ×2 IMPLANT
TRAY FOLEY MTR SLVR 16FR STAT (SET/KITS/TRAYS/PACK) ×2 IMPLANT
WATER STERILE IRR 1000ML POUR (IV SOLUTION) ×1 IMPLANT
YANKAUER SUCT BULB TIP NO VENT (SUCTIONS) ×2 IMPLANT

## 2021-05-04 NOTE — Transfer of Care (Signed)
Immediate Anesthesia Transfer of Care Note  Patient: Carl Kim  Procedure(s) Performed: TRANSFORAMINAL LUMBAR INTERBODY FUSION LUMBAR FIVE THROUGH SACRAL ONE (Spine Lumbar)  Patient Location: PACU  Anesthesia Type:General  Level of Consciousness: awake and alert   Airway & Oxygen Therapy: Patient Spontanous Breathing and Patient connected to face mask oxygen  Post-op Assessment: Report given to RN and Post -op Vital signs reviewed and stable  Post vital signs: Reviewed and stable  Last Vitals:  Vitals Value Taken Time  BP 97/66 05/04/21 1204  Temp    Pulse 25 05/04/21 1206  Resp 16 05/04/21 1206  SpO2 90 % 05/04/21 1206  Vitals shown include unvalidated device data.  Last Pain:  Vitals:   05/04/21 0616  TempSrc:   PainSc: 4          Complications: No notable events documented.

## 2021-05-04 NOTE — Brief Op Note (Signed)
05/04/2021  11:35 AM  PATIENT:  Carl Kim  55 y.o. male  PRE-OPERATIVE DIAGNOSIS:  Degenerative slip with left leg radiculopathy L5-S1  POST-OPERATIVE DIAGNOSIS:  Degenerative slip with left leg radiculopathy L5-S1  PROCEDURE:  Procedure(s): TRANSFORAMINAL LUMBAR INTERBODY FUSION LUMBAR FIVE THROUGH SACRAL ONE (N/A)  SURGEON:  Surgeon(s) and Role:    Venita Lick, MD - Primary  PHYSICIAN ASSISTANT:   ASSISTANTS: Voncille Lo, PA   ANESTHESIA:   general  EBL:  150 mL   BLOOD ADMINISTERED:none  DRAINS: none   LOCAL MEDICATIONS USED:  MARCAINE     SPECIMEN:  No Specimen  DISPOSITION OF SPECIMEN:  N/A  COUNTS:  YES  TOURNIQUET:  * No tourniquets in log *  DICTATION: .Dragon Dictation  PLAN OF CARE: Admit to inpatient   PATIENT DISPOSITION:  PACU - hemodynamically stable.

## 2021-05-04 NOTE — Op Note (Signed)
OPERATIVE REPORT  DATE OF SURGERY: 05/04/2021  PATIENT NAME:  Carl Kim MRN: [id number] DOB: 1965-10-16  PCP: Roderick Pee, PA  PRE-OPERATIVE DIAGNOSIS: Spondylolisthesis with left radiculopathy - L5-S1  POST-OPERATIVE DIAGNOSIS: Same  PROCEDURE:   TLIF L5-S1  SURGEON:  Venita Lick, MD  PHYSICIAN ASSISTANT: Voncille Lo, PA  ANESTHESIA:   General  EBL: 150 ml   Complications: None  Implants: NuVasive expandable TLIF cage.  10 x 11 x 31.  20 degree lordosis.  Cage expanded to approximately 14 mm height  Graft: Autograft from the decompression  Neuro monitoring: All 4 pedicle screws were directly stimulated.  Lowest recorded value where there was EMG activity was 37 mA.  Remainder of the screws were 39 mA.  No adverse free running EMG activity or SSEP activity  BRIEF HISTORY: Carl Kim is a 55 y.o. male who presented to my care with significant back buttock and radicular left leg pain.  Patient had weakness in the L5 dermatome as well as radicular pain and dysesthesias.  Imaging studies demonstrated a pars defect with spondylolisthesis.  After conservative management had failed to alleviate his pain and so he elected to move forward with surgery.  All appropriate risks, benefits, and alternatives to surgery were discussed with the patient and consent was obtained.  PROCEDURE DETAILS: Patient was brought into the operating room and was properly positioned on the operating room table.  After induction with general anesthesia the patient was endotracheally intubated.  A timeout was taken to confirm all important data: including patient, procedure, and the level. Teds, SCD's were applied.  Foley was then inserted, and the neuro monitoring representative placed all appropriate needles for intraoperative SSEP and EMG monitoring.  Patient was turned prone onto the [facility] spine frame and all bony prominences well-padded.  The back was then prepped and draped in a standard  fashion.  Using fluoroscopy identified the lateral border of the L5 and S1 pedicles and marked out my skin incision.  Both the left and right Wiltsie incision sites were infiltrated with quarter percent Marcaine with epinephrine.  Since the patient had radicular left leg pain and weakness I elected to do the needle decompression on this side.    Using fluoroscopy I advanced the Jamshidi needle down to the lateral aspect of the L5 pedicle.  I confirmed using AP fluoroscopy that I was at the appropriate position I then advanced the Jamshidi needle stimulating into the pedicle.  As I neared the medial wall of the pedicle I switched to the lateral view to confirm that I was just beyond the posterior wall of the vertebral body.  Once I confirmed that I had the appropriate trajectory I advanced into the vertebral body.  I then placed a guidepin through the center of the Jamshidi needle and removed it.  With the L5 pedicle now cannulated I repeated this exact same technique at S1.  I then cannulated the contralateral side.  At this point all 4 pedicles were cannulated.  On the left-hand side I dissected down to the deep fascia and incised the deep fascia.  I then measured and then placed the 6.5 x 45 mm length pedicle screw at L5 and the 7.5 x 40 mm length screw at S1.  Both screws had excellent purchase.  Connected to the pedicle screw was the retractor which allowed me to mobilize the paraspinal musculature.  I then placed my medial retractor so I could see the L4-5 and L5-S1 facet complex as well  as the L5 lamina.  The entire L5 lamina was mobile due to the pars defect.  Using a double-action Leksell rongeur I remove the bulk of the inferior L5 facet.  I then used a 3 mm Kerrison rongeur to remove and resect the remainder of the pars and the fibrocartilaginous material that had developed within the pars.  I then began removing a portion of the L5 lamina.  I used my Penfield 4 to gently dissect through the ligamentum  flavum and create a plane between the ligamentum flavum and the thecal sac.  A neuro patty was then placed to protect the thecal sac and I used my 3 mm Kerrison rongeur to remove the ligamentum flavum and complete my L5 laminotomy.  This allowed me to remove the entire remaining portion of the L5 inferior facet.  I then continued my dissection into the lateral recess removing the ligamentum flavum.  The superior portion of the S1 facet was also resected so I had excellent visualization of the posterior lateral aspect of the L5-S1 disc space.  I could now freely identify the S1 nerve.  And I continued removing the medial aspect of the S1 facet so that I was contacting and visualizing the S1 pedicle.  There was no evidence of breach of the pedicle on inspection.  I then went superiorly until I was able to palpate and visualize the inferior aspect of the L5 pedicle.  I could also visualize the L5 nerve root now that I remove the entire pars and facet complex.  5 foramen was completely decompressed.  At this point I placed neuro patties inferiorly and superiorly to protect the exiting and traversing nerve roots and medially to mobilize the thecal sac.  I can now clearly visualize the posterior lateral aspect of the disc space.  Epidural veins were identified and coagulated with bipolar electrocautery.  An annulotomy was created with a 15 blade scalpel and I used a combination of angled curettes pituitary rongeurs, and endplate scrapers to remove all the disc material from the L5-S1 level.  Care was taken to use fluoroscopy while I was using my curette so that I would not advance too far anterior.  Once I had confirmed bleeding subchondral bone and an adequate discectomy I then began using my trial devices.  The size 10 trial provided the best initial contact with the endplates and was not difficult to insert.  I was also able to get the device past the midline when visualized in the AP plane.  After copious irrigation  I placed bone graft along the anterior aspect of the annulus to serve as a sentinel fusion.  I obtained a 10 x 11 x 31 expandable cage and malleted it to the appropriate depth.  The cage was countersunk approximately 2 mm so it was well below the posterior aspect of the L5 vertebral body.  I then expanded the cage to a final anterior height of approximately 13.5 mm.  There was excellent purchase and was secured in the disc space.  I then inserted the bone graft into the cage.  I placed additional bone graft lateral to the cage.  Imaging demonstrated reduction of the spondylolisthesis and proper positioning of the cage.  I was also able to freely pass my nerve hook below the thecal sac confirming the cage was properly seated and was not contacting either the thecal sac or the traversing or exiting nerve root.  At this point the polyaxial heads were attached to the screws.  On the right side the same size screws were placed at L5 and S1 over the guidepins.  Imaging demonstrated satisfactory position of all 4 pedicle screws.  I then directly stimulated the pedicle screws.  On the right side: S1 demonstrated positive activity at 39 mA, L5: No activity greater than 40 A.  On the left side S1 demonstrated some activity at 37 mA, and L5 at 39 mA.  I then measured and placed the appropriate size rod and secured it with a locking caps.  All 4 locking caps were torqued according manufacture standards.  The insertion tabs were then broken off and removed.  Using bipolar cautery as well as topical TXA I was able to obtain hemostasis bilaterally.  Final x-rays demonstrate satisfactory position of the pedicle screw construct as well as intervertebral cage.  I then checked to ensure that the 5 and S1 nerve roots remain decompressed and they were.  I could freely trace the L5 nerve root all the way to the thecal sac.  The foramen had been completely removed and there was no further compression.  Both wounds were closed in a  layered fashion with interrupted #1 Vicryl suture, 0 Vicryl suture, and a 3-0 Monocryl for the skin.  Steri-Strips and dry dressings were applied and the patient was ultimately extubated transfer the PACU without incident.  The end of the case all needle and sponge counts were correct.  First Assistant: Voncille Lo, PA: She was instrumental in assisting with positioning of the patient, retraction for visualization, insertion of implants, and wound closure.   Venita Lick, MD 05/04/2021 11:15 AM

## 2021-05-04 NOTE — Discharge Instructions (Addendum)
  Spinal Fusion, Adult, Care After This sheet gives you information about how to care for yourself after your procedure. Your doctor may also give you more specific instructions. If you have problems or questions, contact your doctor. Follow these instructions at home: Medicines Take over-the-counter and prescription medicines only as told by your doctor. These include any medicines for pain or blood-thinning medicines (anticoagulants). If you were prescribed an antibiotic medicine, take it as told by your doctor. Do not stop taking the antibiotic even if you start to feel better. Do not drive for 24 hours if you were given a medicine to help you relax (sedative) during your procedure. Do not drive or use heavy machinery while taking prescription pain medicine. If you have a brace: Wear the brace as told by your doctor. Take it off only as told by your doctor. Keep the brace clean. Managing pain, stiffness, and swelling If directed, put ice on the surgery area: If you have a removable brace, take it off as told by your doctor. Put ice in a plastic bag. Place a towel between your skin and the bag. Leave the ice on for 20 minutes, 2-3 times a day. Surgery cut care    Follow instructions from your doctor about how to take care of your cut from surgery (incision). Make sure you: Wash your hands with soap and water before you change your bandage (dressing). If you cannot use soap and water, use hand sanitizer. Change your bandage as told by your doctor. Leave stitches (sutures), skin glue, or skin tape (adhesive) strips in place. They may need to stay in place for 2 weeks or longer. If tape strips get loose and curl up, you may trim the loose edges. Do not remove tape strips completely unless your doctor says it is okay. Keep your cut from surgery clean and dry. Do not take baths, swim, or use a hot tub until your doctor says it is okay. Ask your doctor if you can take showers. You may only  be allowed to take sponge baths. Every day, check your cut from surgery and the area around it for: More redness, swelling, or pain. Fluid or blood. Warmth. Pus or a bad smell. If you have a drain tube, follow instructions from your doctor about caring for it. Do not take out the drain tube or any bandages unless your doctor says it is okay. Physical activity Rest and protect your back as much as possible. Follow instructions from your doctor about how to move. Use good posture to help your spine heal. Do not lift anything that is heavier than 8 lb (3.6 kg), or the limit that you are told, until your doctor says that it is safe. Do not twist or bend at the waist until your doctor says it is okay. It is best if you: Do not make pushing and pulling motions. Do not sit or lie down in the same position for a long time. Do not raise your hands or arms above your head. Return to your normal activities as told by your doctor. Ask your doctor what activities are safe for you. Rest and protect your back as much as you can. Do not start to exercise until your doctor says it is okay. Ask your doctor what kinds of exercise you can do to make your back stronger. Ok to shower in 5 days.  Do not take a bath or submerge the wound General instructions To prevent blood clots and lessen swelling   lessen swelling in your legs: Wear compression stockings as told. Walk one or more times every few hours as told by your doctor. Do not use any products that contain nicotine or tobacco, such as cigarettes and e-cigarettes. These can delay bone healing. If you need help quitting, ask your doctor. To prevent or treat constipation while you are taking prescription pain medicine, your doctor may suggest that you: Drink enough fluid to keep your pee (urine) pale yellow. Take over-the-counter or prescription medicines. Eat foods that are high in fiber. These include fresh fruits and vegetables, whole grains, and beans. Limit foods  that are high in fat and processed sugars, such as fried and sweet foods. Keep all follow-up visits as told by your doctor. This is important. Contact a doctor if: Your pain gets worse. Your medicine does not help your pain. Your legs or feet get painful or swollen. Your cut from surgery is more red, swollen, or painful. Your cut from surgery feels warm to the touch. You have: Fluid or blood coming from your cut from surgery. Pus or a bad smell coming from your cut from surgery. A fever. Weakness or loss of feeling (numbness) in your legs that is new or getting worse. Trouble controlling when you pee (urinate) or poop (have a bowel movement). You feel sick to your stomach (nauseous). You throw up (vomit). Get help right away if: Your pain is very bad. You have chest pain. You have trouble breathing. You start to have a cough. These symptoms may be an emergency. Do not wait to see if the symptoms will go away. Get medical help right away. Call your local emergency services (911 in the [place] Do not drive yourself to the hospital. Summary After the procedure, it is common to have pain in your back and pain by your surgery cut(s). Icing and pain medicines may help to control the pain. Follow directions from your doctor. Rest and protect your back as much as possible. Do not twist or bend at the waist. Get up and walk one or more times every few hours as told by your doctor. This information is not intended to replace advice given to you by your health care provider. Make sure you discuss any questions you have with your health care provider.  -signs and symptoms of a blood clot such as chest pain; shortness of breath; pain, swelling, or warmth in the leg -signs and symptoms of a stroke such as changes in vision; confusion; trouble speaking or understanding; severe headaches; sudden numbness or weakness of the face, arm or leg; trouble walking; dizziness; loss of coordination Side effects  that usually do not require medical attention (report to your doctor or health care professional if they continue or are bothersome): -hair loss -pain, redness, or irritation at site where injected This list may not describe all possible side effects. Call your doctor for medical advice about side effects. You may report side effects to FDA at 1-800-FDA-1088. Where should I keep my medicine? Keep out of the reach of children. Store at room temperature between 15 and 30 degrees C (59 and 86 degrees F). Do not freeze. If your injections have been specially prepared, you may need to store them in the refrigerator. Ask your pharmacist. Throw away any unused medicine after the expiration date. NOTE: This sheet is a summary. It may not cover all possible information. If you have questions about this medicine, talk to your doctor, pharmacist, or health care provider.  OK TO  RESTART ASPRIN ON Saturday 05/06/21

## 2021-05-04 NOTE — Anesthesia Postprocedure Evaluation (Signed)
Anesthesia Post Note  Patient: Carl Kim  Procedure(s) Performed: TRANSFORAMINAL LUMBAR INTERBODY FUSION LUMBAR FIVE THROUGH SACRAL ONE (Spine Lumbar)     Patient location during evaluation: PACU Anesthesia Type: General Level of consciousness: awake and alert Pain management: pain level controlled Vital Signs Assessment: post-procedure vital signs reviewed and stable Respiratory status: spontaneous breathing, nonlabored ventilation and respiratory function stable Cardiovascular status: blood pressure returned to baseline and stable Postop Assessment: no apparent nausea or vomiting and adequate PO intake Anesthetic complications: no   No notable events documented.  Last Vitals:  Vitals:   05/04/21 1230 05/04/21 1245  BP: 124/73 113/74  Pulse: 92 83  Resp: 13 10  Temp:    SpO2: 98% 97%    Last Pain:  Vitals:   05/04/21 1245  TempSrc:   PainSc: 0-No pain                 Malayla Granberry,E. Ziare Cryder

## 2021-05-04 NOTE — H&P (Signed)
Addendum H&P: Patient continues to have significant back buttock and neuropathic left leg pain.  Left EHL/tibialis anterior weakness persists.  There is been no change in his clinical exam since his last office visit of 04/21/2021.  Plan on TLIF L5-S1 to decompress the L5 nerve root and stabilize the spondylolisthesis.  All appropriate risks, benefits, and alternatives to surgery were discussed with the patient and he expressed a willingness and desire to move forward with surgery.

## 2021-05-04 NOTE — Anesthesia Procedure Notes (Signed)
Procedure Name: Intubation Date/Time: 05/04/2021 7:37 AM Performed by: Ardyth Harps, CRNA Pre-anesthesia Checklist: Patient identified, Emergency Drugs available, Suction available and Patient being monitored Patient Re-evaluated:Patient Re-evaluated prior to induction Oxygen Delivery Method: Circle System Utilized Preoxygenation: Pre-oxygenation with 100% oxygen Induction Type: IV induction Laryngoscope Size: Mac and 4 Grade View: Grade II Tube type: Oral Tube size: 7.5 mm Number of attempts: 1 Airway Equipment and Method: Stylet and Oral airway Placement Confirmation: ETT inserted through vocal cords under direct vision, positive ETCO2 and breath sounds checked- equal and bilateral Secured at: 23 cm Tube secured with: Tape Dental Injury: Teeth and Oropharynx as per pre-operative assessment

## 2021-05-05 ENCOUNTER — Other Ambulatory Visit (HOSPITAL_COMMUNITY): Payer: Self-pay

## 2021-05-05 MED ORDER — OXYCODONE-ACETAMINOPHEN 10-325 MG PO TABS
ORAL_TABLET | ORAL | 0 refills | Status: DC
  Filled 2021-05-05: qty 20, 5d supply, fill #0

## 2021-05-05 NOTE — Evaluation (Signed)
Occupational Therapy Evaluation Patient Details Name: Carl Kim MRN: [id number] DOB: [birth year] Today's Date: 05/05/2021   History of Present Illness 55 y.o. male presents to [facility] hospital 05/04/2021 with back, buttock, and neuropathic LLE pain. Pt underwent L5-S1 TLIF on 05/04/2021. PMH includes CAD, HLD, HTN.   Clinical Impression   Pt admitted for procedure listed above. PTA pt reported that he was independent with all ADL's and IADL's, including driving. At this time, pt presents with increased pain, however he is able to complete all ADL's and functional mobility with no assist. Pt was educated on compensatory strategies for ADL's and was able to recall all spinal precautions. He has no further OT needs and acute OT will sign off.       Recommendations for follow up therapy are one component of a multi-disciplinary discharge planning process, led by the attending physician.  Recommendations may be updated based on patient status, additional functional criteria and insurance authorization.   Follow Up Recommendations  No OT follow up    Assistance Recommended at Discharge None  Functional Status Assessment  Patient has had a recent decline in their functional status and demonstrates the ability to make significant improvements in function in a reasonable and predictable amount of time.  Equipment Recommendations  None recommended by OT    Recommendations for Other Services       Precautions / Restrictions Precautions Precautions: Back Precaution Booklet Issued: Yes (comment) Required Braces or Orthoses: Spinal Brace Spinal Brace: Lumbar corset;Applied in sitting position Restrictions Weight Bearing Restrictions: No      Mobility Bed Mobility Overal bed mobility: Needs Assistance Bed Mobility: Rolling;Sidelying to Sit Rolling: Supervision Sidelying to sit: Modified independent (Device/Increase time)            Transfers Overall transfer level:  Independent Equipment used: None                      Balance Overall balance assessment: No apparent balance deficits (not formally assessed)                                         ADL either performed or assessed with clinical judgement   ADL Overall ADL's : Modified independent                                       General ADL Comments: Pt able to complete all basic ADL's with no difficulties, using compensatory strategies to follow his spinal precautions.     Vision Baseline Vision/History: 0 No visual deficits Ability to See in Adequate Light: 0 Adequate Patient Visual Report: No change from baseline Vision Assessment?: No apparent visual deficits     Perception     Praxis      Pertinent Vitals/Pain Pain Assessment: Faces Faces Pain Scale: Hurts even more Pain Location: low back, incision Pain Descriptors / Indicators: Sore Pain Intervention(s): Monitored during session;Limited activity within patient's tolerance     Hand Dominance Right   Extremity/Trunk Assessment Upper Extremity Assessment Upper Extremity Assessment: Overall WFL for tasks assessed   Lower Extremity Assessment Lower Extremity Assessment: Defer to PT evaluation   Cervical / Trunk Assessment Cervical / Trunk Assessment: Back Surgery   Communication Communication Communication: No difficulties   Cognition Arousal/Alertness: Awake/alert Behavior During Therapy: [facility] for tasks  assessed/performed Overall Cognitive Status: Within Functional Limits for tasks assessed                                       General Comments  Educated on getting into a car and compensatory strategies for basic ADL's.    Exercises     Shoulder Instructions      Home Living Family/patient expects to be discharged to:: Private residence Living Arrangements: Spouse/significant other Available Help at Discharge: Family;Available PRN/intermittently Type  of Home: House Home Access: Stairs to enter [employer] of Steps: 1 Entrance Stairs-Rails: None Home Layout: Multi-level;Able to live on main level with bedroom/bathroom Alternate Level Stairs-Number of Steps: flight   Bathroom Shower/Tub: Producer, television/film/video: Standard     Home Equipment: None          Prior Functioning/Environment Prior Level of Function : Independent/Modified Independent                        OT Problem List: Decreased strength;Decreased activity tolerance;Pain      OT Treatment/Interventions:      OT Goals(Current goals can be found in the care plan section) Acute Rehab OT Goals Patient Stated Goal: To go home OT Goal Formulation: With patient Time For Goal Achievement: 05/05/21 Potential to Achieve Goals: Good  OT Frequency:     Barriers to D/C:            Co-evaluation              AM-PAC OT "6 Clicks" Daily Activity     Outcome Measure Help from another person eating meals?: None Help from another person taking care of personal grooming?: None Help from another person toileting, which includes using toliet, bedpan, or urinal?: None Help from another person bathing (including washing, rinsing, drying)?: None Help from another person to put on and taking off regular upper body clothing?: None Help from another person to put on and taking off regular lower body clothing?: None 6 Click Score: 24   End of Session Equipment Utilized During Treatment: Back brace Nurse Communication: Mobility status  Activity Tolerance: Patient tolerated treatment well Patient left: in bed;with call bell/phone within reach;with family/visitor present  OT Visit Diagnosis: Unsteadiness on feet (R26.81);Muscle weakness (generalized) (M62.81)                Time: 4782-9562 OT Time Calculation (min): 17 min Charges:  OT General Charges $OT Visit: 1 Visit OT Evaluation $OT Eval Low Complexity: 1 Low  Louvina Cleary H.,  OTR/L Acute Rehabilitation  [place] [facility] [place]ume 05/05/2021, 11:47 AM

## 2021-05-05 NOTE — Discharge Summary (Signed)
Patient ID: Carl Kim MRN: [id number] DOB/AGE: 1966-02-15 55 y.o.  Admit date: 05/04/2021 Discharge date: 05/05/2021  Admission Diagnoses:  Principal Problem:   S/P lumbar fusion   Discharge Diagnoses:  Principal Problem:   S/P lumbar fusion  status post Procedure(s): TRANSFORAMINAL LUMBAR INTERBODY FUSION LUMBAR FIVE THROUGH SACRAL ONE  Past Medical History:  Diagnosis Date   Anginal pain (HCC)    Chest discomfort 2014   due to food poisoning/went to hospital per EMS   Coronary artery disease    Hypertension     Surgeries: Procedure(s): TRANSFORAMINAL LUMBAR INTERBODY FUSION LUMBAR FIVE THROUGH SACRAL ONE on 05/04/2021   Consultants:   Discharged Condition: Improved  Hospital Course: KAIPO ARDIS is an 56 y.o. male who was admitted 05/04/2021 for operative treatment of S/P lumbar fusion. Patient failed conservative treatments (please see the history and physical for the specifics) and had severe unremitting pain that affects sleep, daily activities and work/hobbies. After pre-op clearance, the patient was taken to the operating room on 05/04/2021 and underwent  Procedure(s): TRANSFORAMINAL LUMBAR INTERBODY FUSION LUMBAR FIVE THROUGH SACRAL ONE.    Patient was given perioperative antibiotics:  Anti-infectives (From admission, onward)    Start     Dose/Rate Route Frequency Ordered Stop   05/04/21 1530  ceFAZolin (ANCEF) IVPB 1 g/50 mL premix        1 g 100 mL/hr over 30 Minutes Intravenous Every 8 hours 05/04/21 1257 05/04/21 2340   05/04/21 0550  ceFAZolin (ANCEF) IVPB 2g/100 mL premix        2 g 200 mL/hr over 30 Minutes Intravenous 30 min pre-op 05/04/21 0550 05/04/21 0738        Patient was given sequential compression devices and early ambulation to prevent DVT.   Today, the patient reports improvement of pre-op leg pain. He has been ambulating in the hall. He is voiding and emptying his bladder fully. He is passing gas. He is tolerating PO without any  nausea or vomiting. He denies any CP, SOB, fevers/sweats/chills.  Patient benefited maximally from hospital stay and there were no complications. At the time of discharge, the patient was urinating/moving their bowels without difficulty, tolerating a regular diet, pain is controlled with oral pain medications and they have been cleared by PT/OT.     Recent vital signs: Patient Vitals for the past 24 hrs:  BP Temp Temp src Pulse Resp SpO2  05/05/21 0806 122/85 98.1 F (36.7 C) Oral 72 17 100 %  05/05/21 0529 120/88 99.5 F (37.5 C) Oral 85 20 99 %  05/04/21 2304 127/88 98.4 F (36.9 C) Oral (!) 104 20 99 %  05/04/21 2016 120/76 99 F (37.2 C) Oral 89 20 100 %  05/04/21 1654 119/86 98.4 F (36.9 C) Oral 89 16 100 %     Recent laboratory studies:  Recent Labs    05/03/21 0830  WBC 8.3  HGB 15.5  HCT 45.4  PLT 234  NA 138  K 3.8  CL 101  CO2 31  BUN 14  CREATININE 1.11  GLUCOSE 91  INR 1.0  CALCIUM 9.5    Physical exam from 05/05/2021  General: AAOx3, NAD, wife at bedside.  Chest: Denies CP, non-labored breathing without any accessory muscle use. Patient able to speak in complete sentences.  Abdomen: Non-tender, non-distended. Incision: C/D/I with minimal dry blood spots on dressing AROM: Ankle plantarflexion and dorsiflexion in tact.  Distal sensation to light touch in tact. No LE swelling. Palpable DP pulses bilaterally.  No calf tenderness.    Discharge Medications:   Allergies as of 05/05/2021   No Known Allergies      Medication List     STOP taking these medications    diclofenac 75 MG EC tablet Commonly known as: VOLTAREN   multivitamin with minerals Tabs tablet       TAKE these medications    acetaminophen 325 MG tablet Commonly known as: TYLENOL Take 650 mg by mouth every 6 (six) hours as needed for moderate pain.   aspirin EC 81 MG tablet Take 1 tablet (81 mg total) by mouth daily with breakfast.   fluticasone 50 MCG/ACT nasal  spray Commonly known as: FLONASE Place 1 spray into both nostrils daily as needed for allergies or rhinitis.   gabapentin 300 MG capsule Commonly known as: NEURONTIN Take 300-600 mg by mouth at bedtime.   lisinopril-hydrochlorothiazide 10-12.5 MG tablet Commonly known as: ZESTORETIC Take 0.5 tablets by mouth daily. What changed: how much to take   loratadine 10 MG tablet Commonly known as: CLARITIN Take 10 mg by mouth daily.   methocarbamol 500 MG tablet Commonly known as: Robaxin Take 1 tablet (500 mg total) by mouth every 8 (eight) hours as needed for up to 5 days for muscle spasms.   metoprolol succinate 25 MG 24 hr tablet Commonly known as: TOPROL-XL Take 0.5 tablets (12.5 mg total) by mouth daily.   nitroGLYCERIN 0.4 MG SL tablet Commonly known as: NITROSTAT PLACE 1 TABLET (0.4 MG TOTAL) UNDER THE TONGUE EVERY 5 (FIVE) MINUTES AS NEEDED FOR CHEST PAIN.   ondansetron 4 MG tablet Commonly known as: Zofran Take 1 tablet (4 mg total) by mouth every 8 (eight) hours as needed for nausea or vomiting.   oxyCODONE-acetaminophen 10-325 MG tablet Commonly known as: Percocet Take 1 tablet by mouth every 6 (six) hours as needed for up to 5 days for pain.   rosuvastatin 20 MG tablet Commonly known as: CRESTOR Take 1 tablet (20 mg total) by mouth daily.        Diagnostic Studies: DG Lumbar Spine 2-3 Views  Result Date: 05/04/2021 CLINICAL DATA:  L5-S1 fusion EXAM: LUMBAR SPINE - 2-3 VIEW COMPARISON:  None. FINDINGS: PA and lateral images lumbar spine were obtained in the operating room with C-arm quit mint. These demonstrate pedicle screw and interbody fusion at L5-S1. Hardware in satisfactory position. No acute complication IMPRESSION: Satisfactory pedicle screw and interbody fusion L5-S1. Electronically Signed   By: Marlan Palau M.D.   On: 05/04/2021 13:33   DG C-Arm 1-60 Min-No Report  Result Date: 05/04/2021 Fluoroscopy was utilized by the requesting physician.  No  radiographic interpretation.   DG C-Arm 1-60 Min-No Report  Result Date: 05/04/2021 Fluoroscopy was utilized by the requesting physician.  No radiographic interpretation.   DG C-Arm 1-60 Min-No Report  Result Date: 05/04/2021 Fluoroscopy was utilized by the requesting physician.  No radiographic interpretation.    Discharge Instructions     Incentive spirometry RT   Complete by: As directed         Follow-up Information     Venita Lick, MD. Schedule an appointment as soon as possible for a visit in 2 week(s).   Specialty: Orthopedic Surgery Why: As needed, If symptoms worsen, For suture removal, For wound re-check Contact information: [address] [place] [address] [phone]                 Discharge Plan:  discharge to home under self care with wife.  Script for meds written. Discharge instructions given/ reviewed. LSO when OOB. Restart ASA tomorrow.   Disposition: stable  F/U in 2 weeks.   Signed: Rhodia Albright for [facility] PA-C [facility] [phone] 05/05/2021, 1:47 PM

## 2021-05-05 NOTE — Evaluation (Signed)
Physical Therapy Evaluation Patient Details Name: Carl Kim MRN: [id number] DOB: March 07, 1966 Today's Date: 05/05/2021  History of Present Illness  55 y.o. male presents to [facility] 05/04/2021 with back, buttock, and neuropathic LLE pain. Pt underwent L5-S1 TLIF on 05/04/2021. PMH includes CAD, HLD, HTN.  Clinical Impression  Pt presents to PT with deficits in activity tolerance, balance, power, endurance, and functional mobility. Pt with difficulty mobilizing in bed due to back soreness, PT providing education on back precautions and log roll technique. Pt ambulates well and negotiates stairs without physical assistance. Pt will benefit from continued aggressive mobilization to improve mobility quality and restore independence.       Recommendations for follow up therapy are one component of a multi-disciplinary discharge planning process, led by the attending physician.  Recommendations may be updated based on patient status, additional functional criteria and insurance authorization.  Follow Up Recommendations No PT follow up    Assistance Recommended at Discharge None  Functional Status Assessment Patient has had a recent decline in their functional status and demonstrates the ability to make significant improvements in function in a reasonable and predictable amount of time.  Equipment Recommendations  None recommended by PT    Recommendations for Other Services       Precautions / Restrictions Precautions Precautions: Back Precaution Booklet Issued: Yes (comment) Required Braces or Orthoses: Spinal Brace Spinal Brace: Lumbar corset;Applied in sitting position Restrictions Weight Bearing Restrictions: No      Mobility  Bed Mobility Overal bed mobility: Needs Assistance Bed Mobility: Rolling;Sidelying to Sit Rolling: Supervision Sidelying to sit: Modified independent (Device/Increase time)            Transfers Overall transfer level: Independent Equipment used:  None                    Ambulation/Gait Ambulation/Gait assistance: Independent Gait Distance (Feet): 250 Feet Assistive device: None Gait Pattern/deviations: WFL(Within Functional Limits) Gait velocity: functional Gait velocity interpretation: >2.62 ft/sec, indicative of community ambulatory   General Gait Details: steady step-through gait  Stairs Stairs: Yes Stairs assistance: Modified independent (Device/Increase time) Stair Management: One rail Right;Alternating pattern Number of Stairs: 10    Wheelchair Mobility    Modified Rankin (Stroke Patients Only)       Balance Overall balance assessment: No apparent balance deficits (not formally assessed)                                           Pertinent Vitals/Pain Pain Assessment: Faces Faces Pain Scale: Hurts little more Pain Location: low back, incision Pain Descriptors / Indicators: Sore Pain Intervention(s): Monitored during session    Home Living Family/patient expects to be discharged to:: Private residence Living Arrangements: Spouse/significant other Available Help at Discharge: Family;Available PRN/intermittently Type of Home: House Home Access: Stairs to enter Entrance Stairs-Rails: None Entrance Stairs-Number of Steps: 1 Alternate Level Stairs-Number of Steps: flight Home Layout: Multi-level;Able to live on main level with bedroom/bathroom Home Equipment: None      Prior Function Prior Level of Function : Independent/Modified Independent                     Hand Dominance        Extremity/Trunk Assessment   Upper Extremity Assessment Upper Extremity Assessment: Overall WFL for tasks assessed    Lower Extremity Assessment Lower Extremity Assessment: Overall WFL for tasks  assessed    Cervical / Trunk Assessment Cervical / Trunk Assessment: Back Surgery  Communication   Communication: No difficulties  Cognition Arousal/Alertness: Awake/alert Behavior  During Therapy: WFL for tasks assessed/performed Overall Cognitive Status: Within Functional Limits for tasks assessed                                          General Comments General comments (skin integrity, edema, etc.): VSS on RA    Exercises     Assessment/Plan    PT Assessment Patient needs continued PT services  PT Problem List Decreased activity tolerance;Decreased mobility       PT Treatment Interventions DME instruction;Gait training;Stair training;Functional mobility training;Therapeutic activities;Therapeutic exercise;Balance training;Neuromuscular re-education;Patient/family education    PT Goals (Current goals can be found in the Care Plan section)  Acute Rehab PT Goals Patient Stated Goal: to go home PT Goal Formulation: With patient Time For Goal Achievement: 05/10/21 Potential to Achieve Goals: Good Additional Goals Additional Goal #1: Pt will score >19/24 on DGI to indicate a reduced risk for falls    Frequency Min 5X/week   Barriers to discharge        Co-evaluation               AM-PAC PT "6 Clicks" Mobility  Outcome Measure Help needed turning from your back to your side while in a flat bed without using bedrails?: A Little Help needed moving from lying on your back to sitting on the side of a flat bed without using bedrails?: None Help needed moving to and from a bed to a chair (including a wheelchair)?: None Help needed standing up from a chair using your arms (e.g., wheelchair or bedside chair)?: None Help needed to walk in hospital room?: None Help needed climbing 3-5 steps with a railing? : None 6 Click Score: 23    End of Session Equipment Utilized During Treatment: Back brace Activity Tolerance: Patient tolerated treatment well Patient left: in bed;with call bell/phone within reach;with family/visitor present Nurse Communication: Mobility status PT Visit Diagnosis: Other abnormalities of gait and mobility  (R26.89)    Time: 7096-2836 PT Time Calculation (min) (ACUTE ONLY): 15 min   Charges:   PT Evaluation $PT Eval Low Complexity: 1 Low          Arlyss Gandy, PT, DPT Acute Rehabilitation Pager: [phone] Office [phone]   Arlyss Gandy 05/05/2021, 10:05 AM

## 2021-05-05 NOTE — Plan of Care (Signed)

## 2021-05-05 NOTE — Progress Notes (Signed)
Patient awaiting transport via wheelchair by volunteer for discharge home; with complaints of moderate pain on his back and was medicated recently; incision on his back with honeycomb dressing and is clean, dry and intact; room was checked and accounted for all his belongings; discharge instructions concerning his medications, incision care, follow up appointment and when to call the doctor as needed were all discussed with patient and his wife by RN and both expressed understanding on the instructions given.

## 2021-05-05 NOTE — Progress Notes (Signed)
Subjective: 1 Day Post-Op Procedure(s) (LRB): TRANSFORAMINAL LUMBAR INTERBODY FUSION LUMBAR FIVE THROUGH SACRAL ONE (N/A) Patient seen in rounds at 7 am on 05/05/2021 Patient reports pain as moderate.  Complaining of incisional back pain. Pre-op leg pain improved. Tolerating PO without N/V +ambulation +void +Flatus No CP, SOB, calf pain  Objective: Vital signs in last 24 hours: Temp:  [98.1 F (36.7 C)-99.5 F (37.5 C)] 98.1 F (36.7 C) (12/02 0806) Pulse Rate:  [72-104] 72 (12/02 0806) Resp:  [16-20] 17 (12/02 0806) BP: (119-127)/(76-88) 122/85 (12/02 0806) SpO2:  [99 %-100 %] 100 % (12/02 0806)  Intake/Output from previous day: 12/01 0701 - 12/02 0700 In: 1600 [I.V.:1600] Out: 700 [Urine:550; Blood:150] Intake/Output this shift: No intake/output data recorded.  Recent Labs    05/03/21 0830  HGB 15.5   Recent Labs    05/03/21 0830  WBC 8.3  RBC 5.05  HCT 45.4  PLT 234   Recent Labs    05/03/21 0830  NA 138  K 3.8  CL 101  CO2 31  BUN 14  CREATININE 1.11  GLUCOSE 91  CALCIUM 9.5   Recent Labs    05/03/21 0830  INR 1.0    Neurologically intact ABD soft Neurovascular intact Sensation intact distally Intact pulses distally Dorsiflexion/Plantar flexion intact Incision: dressing C/D/I Compartment soft   Assessment/Plan: 1 Day Post-Op Procedure(s) (LRB): TRANSFORAMINAL LUMBAR INTERBODY FUSION LUMBAR FIVE THROUGH SACRAL ONE (N/A) Advance diet Up with therapy LSO when OOB DVT Ppx: Teds, SCDs, ambulation. Restart ASA tomorrow Encourage IS  D/C today after PT/OT  F/U in 2 weeks     Rhodia Albright 05/05/2021, 1:48 PM

## 2021-05-08 ENCOUNTER — Encounter (HOSPITAL_COMMUNITY): Payer: Self-pay | Admitting: Orthopedic Surgery

## 2021-07-11 ENCOUNTER — Encounter: Payer: Self-pay | Admitting: Podiatry

## 2021-07-12 NOTE — Telephone Encounter (Signed)
Please call patient to schedule appointment.

## 2021-07-24 ENCOUNTER — Ambulatory Visit: Payer: BC Managed Care – PPO | Admitting: Dermatology

## 2021-08-23 ENCOUNTER — Encounter: Payer: Self-pay | Admitting: Cardiology

## 2021-08-23 ENCOUNTER — Other Ambulatory Visit: Payer: Self-pay

## 2021-08-23 ENCOUNTER — Ambulatory Visit (INDEPENDENT_AMBULATORY_CARE_PROVIDER_SITE_OTHER): Payer: BC Managed Care – PPO | Admitting: Cardiology

## 2021-08-23 VITALS — BP 124/90 | HR 60 | Ht 70.0 in | Wt 226.0 lb

## 2021-08-23 DIAGNOSIS — I2510 Atherosclerotic heart disease of native coronary artery without angina pectoris: Secondary | ICD-10-CM

## 2021-08-23 DIAGNOSIS — I10 Essential (primary) hypertension: Secondary | ICD-10-CM | POA: Diagnosis not present

## 2021-08-23 DIAGNOSIS — E785 Hyperlipidemia, unspecified: Secondary | ICD-10-CM | POA: Diagnosis not present

## 2021-08-23 NOTE — Progress Notes (Signed)
?  ?Cardiology Office Note ? ? ?Date:  08/23/2021  ? ?ID:  Carl Kim, DOB Nov 17, 1965, MRN [id number] ? ?PCP:  Roderick Pee, PA  ?Cardiologist:   Rollene Rotunda, MD ? ? ?No chief complaint on file. ? ? ?  ?History of Present Illness: ?Carl Kim is a 56 y.o. male who presents for follow up of CAD. He reported sporadic chest pain that was non-exertional. He underwent calcium score by CT which revealed calcification in the LAD and a calcium score of 42 which placed him in the 78th percentile for age and sex matched controls. He underwent POET which was abnormal revealing ST depressions in inferior and lateral leads. I saw him for pre-cath evaluation. He underwent angiography on 06/22/19 which revealed sequential lesion I the mid LAD of 90% followed by 40% treated with DES, which jailed a D2 branch which had 60% ostial stenosis. He has residual disease in the distal LAD and large OM1 branch.  He had a repeat cath in October 2022 with patent LAD stent and anatomy as below. ? ?Since I saw him he had back surgery.  He did physical therapy and is doing some walking. The patient denies any new symptoms such as chest discomfort, neck or arm discomfort. There has been no new shortness of breath, PND or orthopnea. There have been no reported palpitations, presyncope or syncope.  ? ? ?Past Medical History:  ?Diagnosis Date  ? Anginal pain (HCC)   ? Chest discomfort 2014  ? due to food poisoning/went to hospital per EMS  ? Coronary artery disease   ? Hypertension   ? ? ?Past Surgical History:  ?Procedure Laterality Date  ? CERVICAL DISCECTOMY  2016  ? C4-5  ? CORONARY STENT INTERVENTION N/A 06/22/2019  ? Procedure: CORONARY STENT INTERVENTION;  Surgeon: Yvonne Kendall, MD;  Location: [facility] INVASIVE CV LAB;  Service: Cardiovascular;  Laterality: N/A;  ? LEFT HEART CATH AND CORONARY ANGIOGRAPHY N/A 06/22/2019  ? Procedure: LEFT HEART CATH AND CORONARY ANGIOGRAPHY;  Surgeon: Yvonne Kendall, MD;  Location: [facility] INVASIVE CV LAB;   Service: Cardiovascular;  Laterality: N/A;  ? LEFT HEART CATH AND CORONARY ANGIOGRAPHY N/A 03/21/2021  ? Procedure: LEFT HEART CATH AND CORONARY ANGIOGRAPHY;  Surgeon: [place]az[place], Peter M, MD;  Location: [facility] INVASIVE CV LAB;  Service: Cardiovascular;  Laterality: N/A;  ? TRANSFORAMINAL LUMBAR INTERBODY FUSION (TLIF) WITH PEDICLE SCREW FIXATION 1 LEVEL N/A 05/04/2021  ? Procedure: TRANSFORAMINAL LUMBAR INTERBODY FUSION LUMBAR FIVE THROUGH SACRAL ONE;  Surgeon: Venita Lick, MD;  Location: [facility] OR;  Service: Orthopedics;  Laterality: N/A;  ? WRIST SURGERY    ? Left  ? ? ? ?Current Outpatient Medications  ?Medication Sig Dispense Refill  ? aspirin EC 81 MG tablet Take 1 tablet (81 mg total) by mouth daily with breakfast. 30 tablet 1  ? fluticasone (FLONASE) 50 MCG/ACT nasal spray Place 1 spray into both nostrils daily as needed for allergies or rhinitis.    ? gabapentin (NEURONTIN) 300 MG capsule Take 300-600 mg by mouth at bedtime.    ? lisinopril-hydrochlorothiazide (ZESTORETIC) 10-12.5 MG tablet Take 0.5 tablets by mouth daily. (Patient taking differently: Take 1 tablet by mouth daily.) 90 tablet 1  ? loratadine (CLARITIN) 10 MG tablet Take 10 mg by mouth daily.    ? metoprolol succinate (TOPROL-XL) 25 MG 24 hr tablet Take 0.5 tablets (12.5 mg total) by mouth daily. 45 tablet 7  ? Multiple Vitamin (MULTIVITAMIN) tablet Take 1 tablet by mouth daily.    ?  nitroGLYCERIN (NITROSTAT) 0.4 MG SL tablet PLACE 1 TABLET (0.4 MG TOTAL) UNDER THE TONGUE EVERY 5 (FIVE) MINUTES AS NEEDED FOR CHEST PAIN. 90 tablet 3  ? rosuvastatin (CRESTOR) 20 MG tablet Take 1 tablet (20 mg total) by mouth daily. 90 tablet 1  ? acetaminophen (TYLENOL) 325 MG tablet Take 650 mg by mouth every 6 (six) hours as needed for moderate pain. (Patient not taking: Reported on 08/23/2021)    ? ondansetron (ZOFRAN) 4 MG tablet Take 1 tablet (4 mg total) by mouth every 8 (eight) hours as needed for nausea or vomiting. (Patient not taking: Reported on 08/23/2021) 20  tablet 0  ? ?No current facility-administered medications for this visit.  ? ? ?Allergies:   Patient has no known allergies.  ? ? ?ROS:  Please see the history of present illness.   Otherwise, review of systems are positive for none.   All other systems are reviewed and negative.  ? ? ?PHYSICAL EXAM: ?VS:  BP 124/90   Pulse 60   Ht 5\' 10"  (1.778 m)   Wt 226 lb (102.5 kg)   BMI 32.43 kg/m?  , BMI Body mass index is 32.43 kg/m?. ?GENERAL:  Well appearing ?NECK:  No jugular venous distention, waveform within normal limits, carotid upstroke brisk and symmetric, no bruits, no thyromegaly ?LUNGS:  Clear to auscultation bilaterally ?CHEST:  Unremarkable ?HEART:  PMI not displaced or sustained,S1 and S2 within normal limits, no S3, no S4, no clicks, no rubs, no murmurs ?ABD:  Flat, positive bowel sounds normal in frequency in pitch, no bruits, no rebound, no guarding, no midline pulsatile mass, no hepatomegaly, no splenomegaly ?EXT:  2 plus pulses throughout, no edema, no cyanosis no clubbing ? ? ?Cardiac cath ? ?Diagnostic ?Dominance: Right ? ? ?EKG:  EKG is  not ordered today. ? ?. ? ? ?Recent Labs: ?05/03/2021: BUN 14; Creatinine, Ser 1.11; Hemoglobin 15.5; Platelets 234; Potassium 3.8; Sodium 138  ? ? ?Lipid Panel ?   ?Component Value Date/Time  ? CHOL 91 (L) 08/17/2019 0839  ? TRIG 76 08/17/2019 0839  ? HDL 40 08/17/2019 0839  ? CHOLHDL 2.3 08/17/2019 0839  ? LDLCALC 35 08/17/2019 0839  ? ?  ? ?Wt Readings from Last 3 Encounters:  ?08/23/21 226 lb (102.5 kg)  ?05/04/21 225 lb (102.1 kg)  ?05/03/21 245 lb (111.1 kg)  ?  ? ? ?Other studies Reviewed: ?Additional studies/ records that were reviewed today include: Labs ? ? ?ASSESSMENT AND PLAN: ? ?CAD:   He had anatomy as above.  The patient has no new sypmtoms.  No further cardiovascular testing is indicated.  We will continue with aggressive risk reduction and meds as listed. ? ?DYSLIPIDEMIA: LDL was 51 with an HDL of 42.  We talked about a plant-based diet.  I have  suggested that he get an LP(a) the next time he gets his blood work drawn. ? ?HTN: The blood pressure is at target.  No change in therapy.   ? ?Current medicines are reviewed at length with the patient today.  The patient does not have concerns regarding medicines. ? ?The following changes have been made:   None ? ?Labs/ tests ordered today include: None ? ?No orders of the defined types were placed in this encounter. ? ? ? ?Disposition:   FU with me in 12 months.   ? ? ?Signed, ?05/05/21, MD  ?08/23/2021 1:27 PM    ?[facility] ? ? ?

## 2021-08-23 NOTE — Patient Instructions (Signed)
Medication Instructions:  ?The current medical regimen is effective;  continue present plan and medications. ? ?*If you need a refill on your cardiac medications before your next appointment, please call your pharmacy* ? ? ?Lab Work: ?LP(a) with next blood draw. ? ?If you have labs (blood work) drawn today and your tests are completely normal, you will receive your results only by: ?MyChart Message (if you have MyChart) OR ?A paper copy in the mail ?If you have any lab test that is abnormal or we need to change your treatment, we will call you to review the results. ? ?Follow-Up: ?At [facility], you and your health needs are our priority.  As part of our continuing mission to provide you with exceptional heart care, we have created designated Provider Care Teams.  These Care Teams include your primary Cardiologist (physician) and Advanced Practice Providers (APPs -  Physician Assistants and Nurse Practitioners) who all work together to provide you with the care you need, when you need it. ? ?We recommend signing up for the patient portal called "MyChart".  Sign up information is provided on this After Visit Summary.  MyChart is used to connect with patients for Virtual Visits (Telemedicine).  Patients are able to view lab/test results, encounter notes, upcoming appointments, etc.  Non-urgent messages can be sent to your provider as well.   ?To learn more about what you can do with MyChart, go to [email or web address].   ? ?Your next appointment:   ?1 year(s) ? ?The format for your next appointment:   ?In Person ? ?Provider:   ?Rollene Rotunda, MD{ ? ?Thank you for choosing [facility]!! ? ? ? ?

## 2022-09-12 ENCOUNTER — Ambulatory Visit: Payer: BC Managed Care – PPO | Admitting: Cardiology

## 2022-11-29 IMAGING — RF DG LUMBAR SPINE 2-3V
1 series · 3 of 3 positions shown · non-contrast
Comparison: None.

CLINICAL DATA: L5-S1 fusion

EXAM:
LUMBAR SPINE - 2-3 VIEW

[Series 1: run · 3 of 3 slices shown]
[im 1/3]
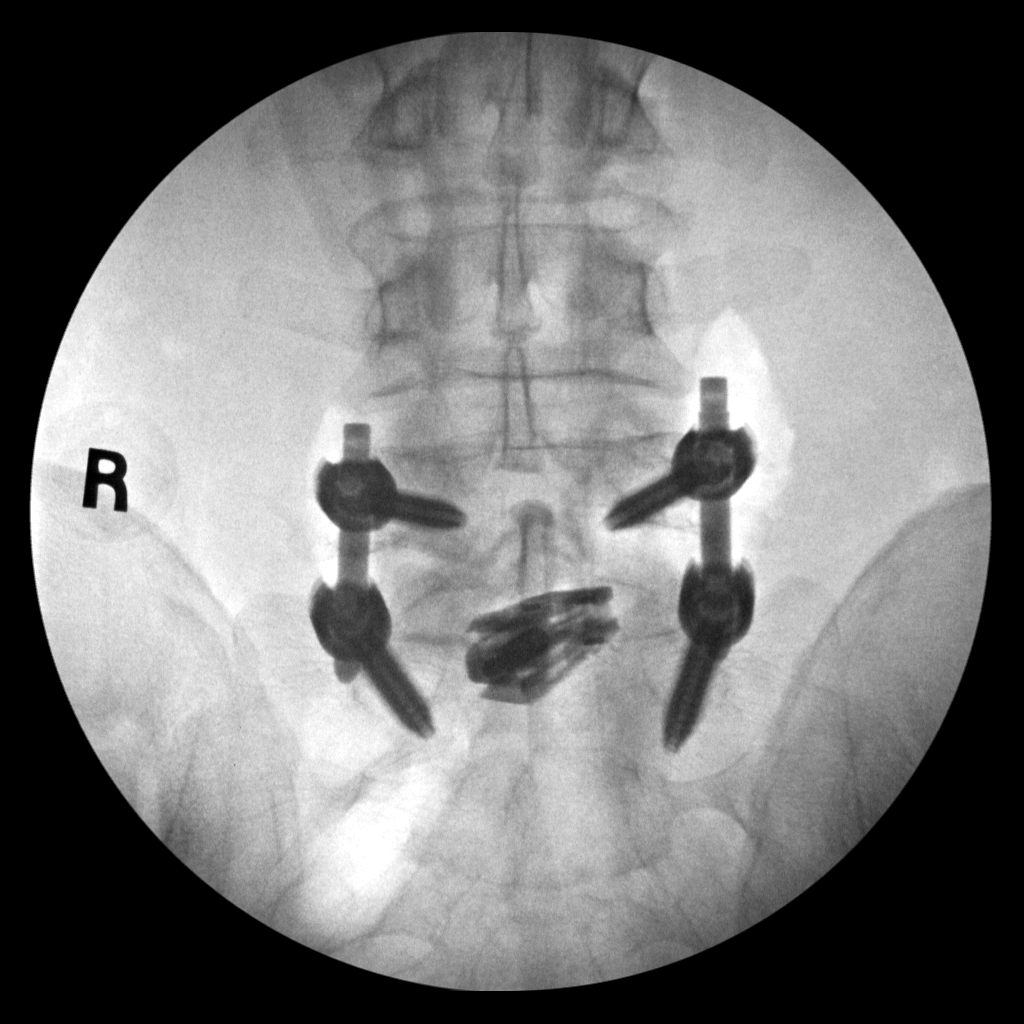
[im 2/3]
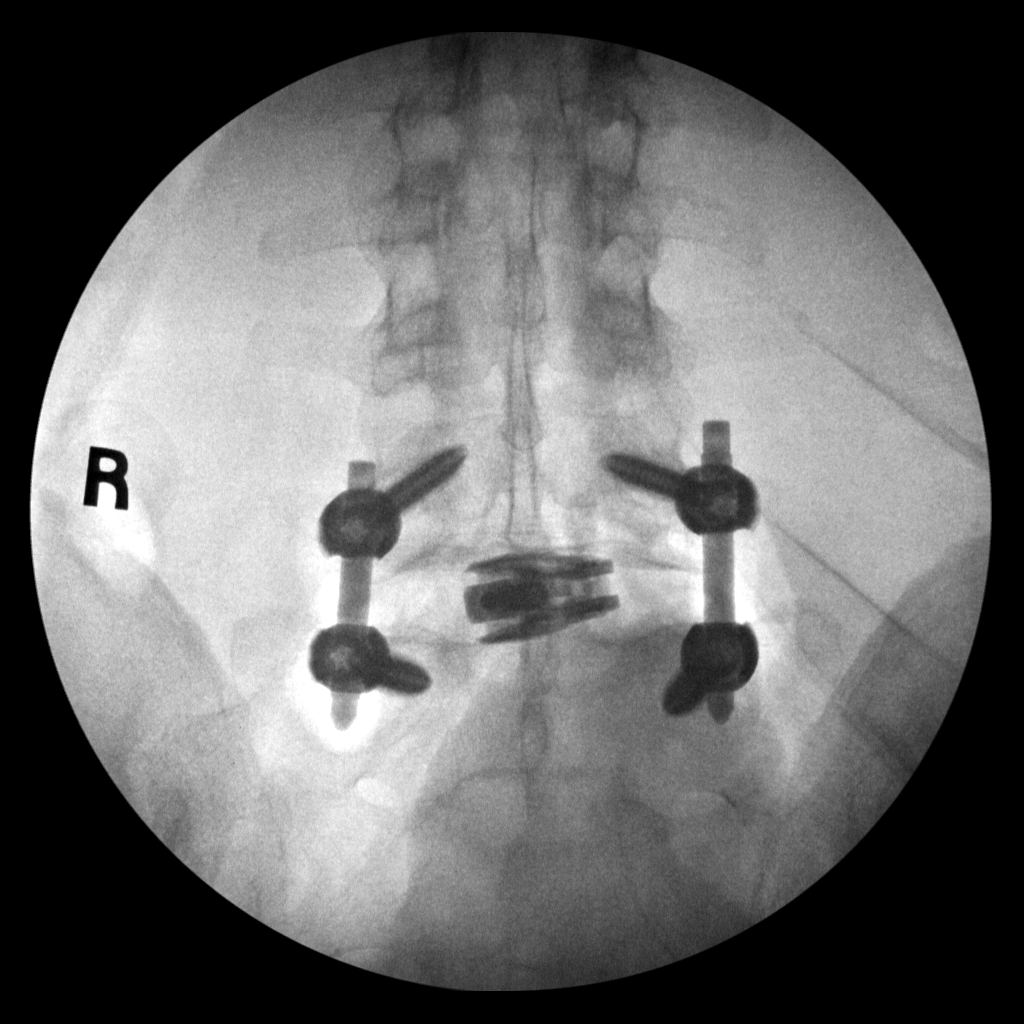
[im 3/3]
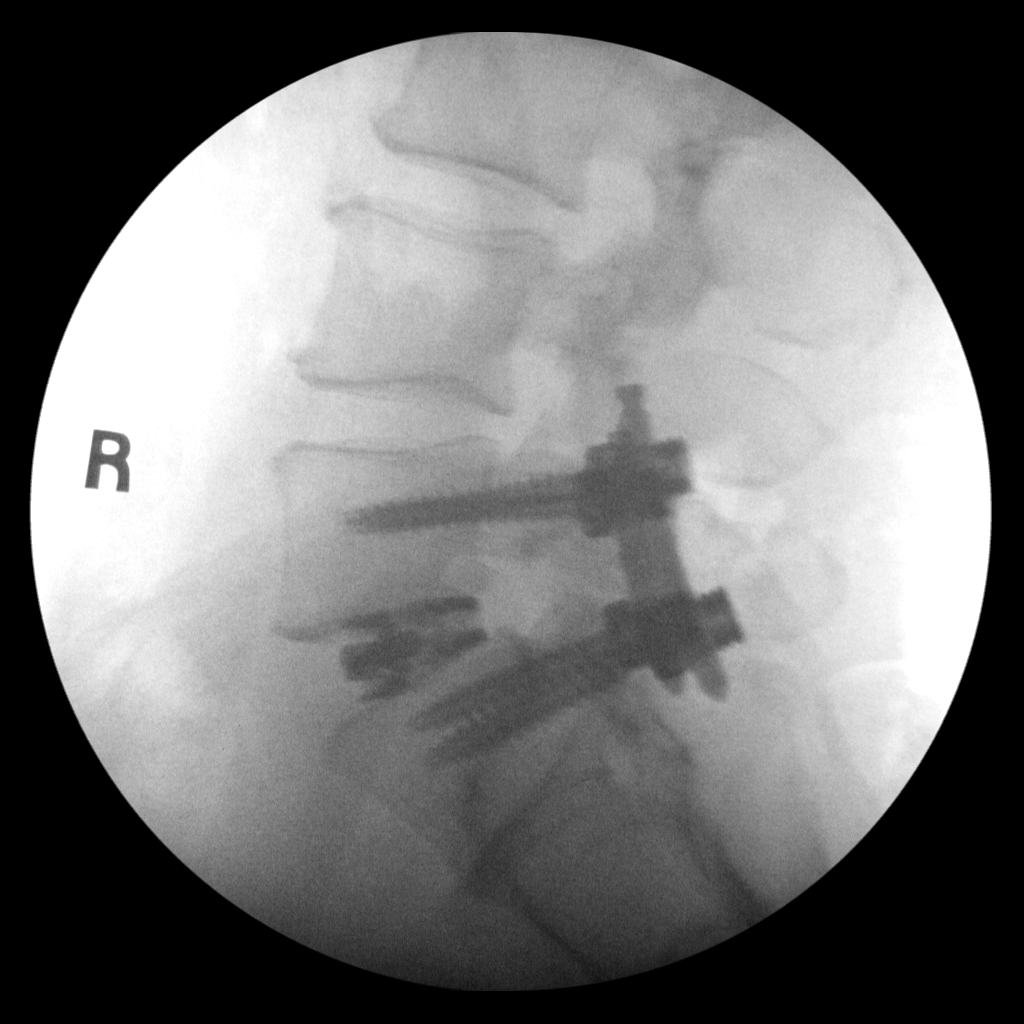

[3 of 3 positions shown; findings below may reference images not displayed]

FINDINGS: PA and lateral images lumbar spine were obtained in the operating
room with C-arm quit mint. These demonstrate pedicle screw and
interbody fusion at L5-S1. Hardware in satisfactory position. No
acute complication
IMPRESSION: Satisfactory pedicle screw and interbody fusion L5-S1.

## 2023-03-26 NOTE — Progress Notes (Unsigned)
  Cardiology Office Note:   Date:  03/27/2023  ID:  Carl Kim, DOB [birth year], MRN [id number] PCP: Roderick Pee, PA (Inactive)  [facility] Providers Cardiologist:  Rollene Rotunda, MD {  History of Present Illness:   Carl Kim is a 57 y.o. male who presents for follow up of CAD. He reported sporadic chest pain that was non-exertional. He underwent calcium score by CT which revealed calcification in the LAD and a calcium score of 42 which placed him in the 78th percentile for age and sex matched controls. He underwent POET which was abnormal revealing ST depressions in inferior and lateral leads. I saw him for pre-cath evaluation. He underwent angiography on 06/22/19 which revealed sequential lesion I the mid LAD of 90% followed by 40% treated with DES, which jailed a D2 branch which had 60% ostial stenosis. He has residual disease in the distal LAD and large OM1 branch.  He had a repeat cath in October 2022 with patent LAD stent and anatomy as below.   Since I saw him he has done well. The patient denies any new symptoms such as chest discomfort, neck or arm discomfort. There has been no new shortness of breath, PND or orthopnea. There have been no reported palpitations, presyncope or syncope.  He does not exercise as much as I would like but he still does walk 2-3 times a week.  ROS: As stated in the HPI and negative for all other systems.  Studies Reviewed:    EKG:   EKG Interpretation Date/Time:  Wednesday March 27 2023 13:59:21 EDT Ventricular Rate:  67 PR Interval:  164 QRS Duration:  96 QT Interval:  406 QTC Calculation: 429 R Axis:   25  Text Interpretation: Normal sinus rhythm Normal ECG When compared with ECG of 30-Jul-2019 11:00, Non-specific change in ST segment in Anterior leads Confirmed by Rollene Rotunda ([id number]) on 03/27/2023 2:50:00 PM     Risk Assessment/Calculations:             Physical Exam:   VS:  BP (!) 128/90   Pulse 67   Ht 5\' 10"   (1.778 m)   Wt 219 lb (99.3 kg)   BMI 31.42 kg/m    Wt Readings from Last 3 Encounters:  03/27/23 219 lb (99.3 kg)  08/23/21 226 lb (102.5 kg)  05/04/21 225 lb (102.1 kg)     GEN: Well nourished, well developed in no acute distress NECK: No JVD; No carotid bruits CARDIAC: RRR, no murmurs, rubs, gallops RESPIRATORY:  Clear to auscultation without rales, wheezing or rhonchi  ABDOMEN: Soft, non-tender, non-distended EXTREMITIES:  No edema; No deformity   ASSESSMENT AND PLAN:   CAD:   The patient has no new sypmtoms.  No further cardiovascular testing is indicated.  We will continue with aggressive risk reduction and meds as listed.   DYSLIPIDEMIA: LDL was 62 with an HDL of 45.  No change in therapy.    HTN: The blood pressure is upper limits of normal.  No change in therapy.        Follow up with me in about one year.   Signed, Rollene Rotunda, MD

## 2023-03-27 ENCOUNTER — Encounter: Payer: Self-pay | Admitting: Cardiology

## 2023-03-27 ENCOUNTER — Ambulatory Visit: Payer: BC Managed Care – PPO | Admitting: Cardiology

## 2023-03-27 VITALS — BP 128/90 | HR 67 | Ht 70.0 in | Wt 219.0 lb

## 2023-03-27 DIAGNOSIS — I2510 Atherosclerotic heart disease of native coronary artery without angina pectoris: Secondary | ICD-10-CM

## 2023-03-27 DIAGNOSIS — E785 Hyperlipidemia, unspecified: Secondary | ICD-10-CM

## 2023-03-27 DIAGNOSIS — I10 Essential (primary) hypertension: Secondary | ICD-10-CM

## 2023-03-27 NOTE — Patient Instructions (Signed)

## 2023-04-12 ENCOUNTER — Ambulatory Visit (INDEPENDENT_AMBULATORY_CARE_PROVIDER_SITE_OTHER): Payer: BC Managed Care – PPO

## 2023-04-12 ENCOUNTER — Encounter: Payer: Self-pay | Admitting: Podiatry

## 2023-04-12 ENCOUNTER — Ambulatory Visit: Payer: BC Managed Care – PPO | Admitting: Podiatry

## 2023-04-12 DIAGNOSIS — M722 Plantar fascial fibromatosis: Secondary | ICD-10-CM | POA: Diagnosis not present

## 2023-04-12 DIAGNOSIS — M2012 Hallux valgus (acquired), left foot: Secondary | ICD-10-CM | POA: Diagnosis not present

## 2023-04-14 NOTE — Progress Notes (Signed)
Subjective:   Patient ID: Carl Kim, male   DOB: 57 y.o.   MRN: [id number]   HPI Patient presents stating my bunion has gotten worse and it is getting more painful and the area where I have the nodules underneath are also becoming more bothersome and I do have it on my hand and my mom also has the same condition.  States he has tried wider shoes he has tried cushioning the area he has tried anti-inflammatories.  The nodules measure approximately 8 mm x 1.5 cm in length   ROS      Objective:  Physical Exam  Neurovascular status intact with the patient found to have hyperostosis medial aspect first metatarsal left over right redness around the side that is painful and lesions in the distal portion of the medial band plantar fascia left that are painful when pressed.  Patient has good digital perfusion well oriented x 3     Assessment:  Structural HAV deformity left foot becoming increasingly symptomatic over the last couple years along with plantar fibroma formation left that grown in size and painful     Plan:  H&P done x-rays reviewed condition discussed at great length.  I do think distal osteotomy along with excision of plantar fibromas would be best for this patient and I explained the procedures risk and recovery.  I allowed him to read consent form going over alternative treatments and complications to be considered and patient wants surgery and at this point after extensive review signed consent form.  He understands he will be immobilized for about 4 to 6 weeks and that total recovery will take approximately 6 months.  Patient scheduled outpatient surgery and at this point I went ahead and I dispensed air fracture walker placed on his lower leg fitted properly and I want him to wear it prior to surgery to get used to it and adjust and find a shoe on the other foot that feels comfortable  X-rays indicate there is elevation of the 1 2 intermetatarsal angle left of approximate 15  degrees and moderate deviation of the toe in sesamoidal position

## 2023-04-29 ENCOUNTER — Telehealth: Payer: Self-pay | Admitting: Podiatry

## 2023-04-29 NOTE — Telephone Encounter (Signed)
DOS-05/21/2023  Carl Kim [id number] PLANTAR FIBROMA [id number]  [employer] EFFECTIVE DATE- 12/03/2022  DEDUCTIBLE - $2000.00 WITH REMAINING $1676.34 OOP- 4000.00 WITH REMAINING $3453.77 COINSURANCE- 10%  SPOKE WITH [name] FROM [employer] AND SHE STATED THAT PRIOR AUTH IS NOT REQUIRED FOR CPT CODES 84166 AND [phone].  CALL REFERENCE #: [name] 04/29/2023 @ 9:29AM EST

## 2023-05-20 MED ORDER — ONDANSETRON HCL 4 MG PO TABS: 4.0000 mg | ORAL_TABLET | Freq: Three times a day (TID) | ORAL | 0 refills | Status: AC | PRN

## 2023-05-20 MED ORDER — OXYCODONE-ACETAMINOPHEN 10-325 MG PO TABS: 1.0000 | ORAL_TABLET | ORAL | 0 refills | Status: AC | PRN

## 2023-05-20 NOTE — Addendum Note (Signed)
Addended by: Lenn Sink on: 05/20/2023 02:49 PM   Modules accepted: Orders

## 2023-05-21 DIAGNOSIS — D492 Neoplasm of unspecified behavior of bone, soft tissue, and skin: Secondary | ICD-10-CM | POA: Diagnosis not present

## 2023-05-21 DIAGNOSIS — M2011 Hallux valgus (acquired), right foot: Secondary | ICD-10-CM | POA: Diagnosis not present

## 2023-05-21 HISTORY — PX: OTHER SURGICAL HISTORY: SHX169

## 2023-05-31 ENCOUNTER — Ambulatory Visit (INDEPENDENT_AMBULATORY_CARE_PROVIDER_SITE_OTHER): Payer: BC Managed Care – PPO | Admitting: Podiatry

## 2023-05-31 ENCOUNTER — Ambulatory Visit (INDEPENDENT_AMBULATORY_CARE_PROVIDER_SITE_OTHER): Payer: BC Managed Care – PPO

## 2023-05-31 ENCOUNTER — Encounter: Payer: Self-pay | Admitting: Podiatry

## 2023-05-31 DIAGNOSIS — Z9889 Other specified postprocedural states: Secondary | ICD-10-CM

## 2023-05-31 DIAGNOSIS — M2012 Hallux valgus (acquired), left foot: Secondary | ICD-10-CM | POA: Diagnosis not present

## 2023-06-02 NOTE — Progress Notes (Signed)
Subjective:   Patient ID: Carl Kim, male   DOB: 57 y.o.   MRN: [id number]   HPI Patient states doing very well with surgery very pleased so far   ROS      Objective:  Physical Exam  Neurovascular status intact negative Denna Haggard' sign noted left foot healing well wound edges well coapted hallux rectus position plantar arch incision healing well stitches intact     Assessment:  Doing well post forefoot reconstruction     Plan:  H&P reviewed and I have recommended continued elevation compression immobilization and reapplied sterile dressing.  Reappoint 2 weeks suture removal continue boot until that time  X-rays indicate good alignment of the first MPJ fixation in place joint congruence

## 2023-06-03 ENCOUNTER — Other Ambulatory Visit (HOSPITAL_COMMUNITY): Payer: Self-pay

## 2023-06-14 ENCOUNTER — Encounter: Payer: Self-pay | Admitting: Podiatry

## 2023-06-14 ENCOUNTER — Ambulatory Visit (INDEPENDENT_AMBULATORY_CARE_PROVIDER_SITE_OTHER): Payer: BC Managed Care – PPO

## 2023-06-14 ENCOUNTER — Ambulatory Visit (INDEPENDENT_AMBULATORY_CARE_PROVIDER_SITE_OTHER): Payer: BC Managed Care – PPO | Admitting: Podiatry

## 2023-06-14 DIAGNOSIS — Z9889 Other specified postprocedural states: Secondary | ICD-10-CM

## 2023-06-17 NOTE — Progress Notes (Signed)
 Subjective:   Patient ID: Carl Kim, male   DOB: 58 y.o.   MRN: [id number]   HPI Patient states overall doing well moderate swelling still noted first metatarsal   ROS      Objective:  Physical Exam  Neuro vascular status intact negative [place]' sign noted incision site left plantar foot doing well first MPJ doing well moderate edema noted     Assessment:  Doing well with plantar fibroma left resection and HAV correction left moderate swelling     Plan:  Agent P precautionary x-ray stitches removed plantar wound edges well coapted instructed on range of motion exercises boot usage surgical shoe usage and continued compression.  Reappoint 4 weeks or earlier if needed  X-rays indicate osteotomies healing well joint congruence fixation in place

## 2023-07-11 ENCOUNTER — Encounter: Payer: BC Managed Care – PPO | Admitting: Podiatry

## 2024-07-15 ENCOUNTER — Ambulatory Visit: Admitting: Cardiology
